# Patient Record
Sex: Female | Born: 1970
Health system: Southern US, Community
[De-identification: ages and names within clinical notes are randomized; demographics above are authoritative.]

## PROBLEM LIST (undated history)

## (undated) DIAGNOSIS — M25832 Other specified joint disorders, left wrist: Secondary | ICD-10-CM

## (undated) HISTORY — PX: BACK SURGERY: SHX140

## (undated) HISTORY — PX: APPENDECTOMY: SHX54

---

## 1998-06-08 ENCOUNTER — Ambulatory Visit (HOSPITAL_COMMUNITY): Admission: RE | Admit: 1998-06-08 | Discharge: 1998-06-08 | Payer: Self-pay | Admitting: Obstetrics and Gynecology

## 1998-08-07 ENCOUNTER — Inpatient Hospital Stay (HOSPITAL_COMMUNITY): Admission: AD | Admit: 1998-08-07 | Discharge: 1998-08-10 | Payer: Self-pay | Admitting: *Deleted

## 1998-08-11 ENCOUNTER — Encounter (HOSPITAL_COMMUNITY): Admission: RE | Admit: 1998-08-11 | Discharge: 1998-11-09 | Payer: Self-pay | Admitting: *Deleted

## 1999-09-26 ENCOUNTER — Ambulatory Visit (HOSPITAL_BASED_OUTPATIENT_CLINIC_OR_DEPARTMENT_OTHER): Admission: RE | Admit: 1999-09-26 | Discharge: 1999-09-26 | Payer: Self-pay | Admitting: Plastic Surgery

## 2000-10-17 ENCOUNTER — Inpatient Hospital Stay (HOSPITAL_COMMUNITY): Admission: AD | Admit: 2000-10-17 | Discharge: 2000-10-19 | Payer: Self-pay | Admitting: *Deleted

## 2000-12-02 ENCOUNTER — Other Ambulatory Visit: Admission: RE | Admit: 2000-12-02 | Discharge: 2000-12-02 | Payer: Self-pay | Admitting: *Deleted

## 2001-07-02 ENCOUNTER — Encounter: Payer: Self-pay | Admitting: Family Medicine

## 2001-07-02 ENCOUNTER — Ambulatory Visit (HOSPITAL_COMMUNITY): Admission: RE | Admit: 2001-07-02 | Discharge: 2001-07-02 | Payer: Self-pay | Admitting: Family Medicine

## 2001-07-16 ENCOUNTER — Encounter: Payer: Self-pay | Admitting: Family Medicine

## 2001-07-16 ENCOUNTER — Ambulatory Visit (HOSPITAL_COMMUNITY): Admission: RE | Admit: 2001-07-16 | Discharge: 2001-07-16 | Payer: Self-pay | Admitting: Family Medicine

## 2001-12-14 ENCOUNTER — Other Ambulatory Visit: Admission: RE | Admit: 2001-12-14 | Discharge: 2001-12-14 | Payer: Self-pay | Admitting: *Deleted

## 2003-01-17 ENCOUNTER — Other Ambulatory Visit: Admission: RE | Admit: 2003-01-17 | Discharge: 2003-01-17 | Payer: Self-pay | Admitting: *Deleted

## 2004-01-20 ENCOUNTER — Other Ambulatory Visit: Admission: RE | Admit: 2004-01-20 | Discharge: 2004-01-20 | Payer: Self-pay | Admitting: Obstetrics and Gynecology

## 2005-04-18 ENCOUNTER — Other Ambulatory Visit: Admission: RE | Admit: 2005-04-18 | Discharge: 2005-04-18 | Payer: Self-pay | Admitting: Obstetrics and Gynecology

## 2013-10-20 ENCOUNTER — Emergency Department (HOSPITAL_COMMUNITY): Payer: BC Managed Care – PPO

## 2013-10-20 ENCOUNTER — Emergency Department (HOSPITAL_COMMUNITY)
Admission: EM | Admit: 2013-10-20 | Discharge: 2013-10-20 | Disposition: A | Discharge status: Home / Self Care | Payer: BC Managed Care – PPO | Attending: Emergency Medicine | Admitting: Emergency Medicine

## 2013-10-20 ENCOUNTER — Encounter (HOSPITAL_COMMUNITY): Payer: Self-pay | Admitting: Emergency Medicine

## 2013-10-20 DIAGNOSIS — R0789 Other chest pain: Secondary | ICD-10-CM | POA: Insufficient documentation

## 2013-10-20 DIAGNOSIS — Z791 Long term (current) use of non-steroidal anti-inflammatories (NSAID): Secondary | ICD-10-CM | POA: Insufficient documentation

## 2013-10-20 DIAGNOSIS — Z79899 Other long term (current) drug therapy: Secondary | ICD-10-CM | POA: Insufficient documentation

## 2013-10-20 LAB — CBC
HCT: 41.6 % (ref 36.0–46.0)
Hemoglobin: 13.8 g/dL (ref 12.0–15.0)
MCH: 32.1 pg (ref 26.0–34.0)
MCHC: 33.2 g/dL (ref 30.0–36.0)
MCV: 96.7 fL (ref 78.0–100.0)
PLATELETS: 379 10*3/uL (ref 150–400)
RBC: 4.3 MIL/uL (ref 3.87–5.11)
RDW: 12.4 % (ref 11.5–15.5)
WBC: 9.8 10*3/uL (ref 4.0–10.5)

## 2013-10-20 LAB — D-DIMER, QUANTITATIVE (NOT AT ARMC): D-Dimer, Quant: <0.27 ug/mL-FEU (ref 0.00–0.48)

## 2013-10-20 LAB — I-STAT TROPONIN, ED: Troponin i, poc: 0 ng/mL (ref 0.00–0.08)

## 2013-10-20 LAB — BASIC METABOLIC PANEL
ANION GAP: 14 (ref 5–15)
BUN: 15 mg/dL (ref 6–23)
CALCIUM: 9.4 mg/dL (ref 8.4–10.5)
CO2: 22 mEq/L (ref 19–32)
Chloride: 102 mEq/L (ref 96–112)
Creatinine, Ser: 0.84 mg/dL (ref 0.50–1.10)
GFR calc non Af Amer: 83 mL/min — ABNORMAL LOW (ref >90)
Glucose, Bld: 111 mg/dL — ABNORMAL HIGH (ref 70–99)
Potassium: 4.1 mEq/L (ref 3.7–5.3)
Sodium: 138 mEq/L (ref 137–147)

## 2013-10-20 LAB — PRO B NATRIURETIC PEPTIDE: Pro B Natriuretic peptide (BNP): 55 pg/mL (ref 0–125)

## 2013-10-20 MED ORDER — IBUPROFEN 800 MG PO TABS: 800.0000 mg | ORAL_TABLET | Freq: Three times a day (TID) | ORAL | Status: DC | PRN

## 2013-10-20 MED ORDER — HYDROCODONE-ACETAMINOPHEN 5-325 MG PO TABS: 1.0000 | ORAL_TABLET | Freq: Four times a day (QID) | ORAL | Status: DC | PRN

## 2013-10-20 NOTE — ED Notes (Signed)
ekg shown to dr Winfred Leeds

## 2013-10-20 NOTE — ED Notes (Signed)
Introduced self to pt.  Transport here to take pt for CXR.

## 2013-10-20 NOTE — ED Notes (Signed)
Pt. reports mid/left chest pain onset 2 weeks ago with mild SOB , denies nausea/ no diaphoresis , pain worse with deep inspiration . No cough or congestion .

## 2013-10-20 NOTE — ED Notes (Signed)
Pain off and on over past two weeks, left mid chest.  Pain worsens with deep inspiration.  Pain has been constant ache since yesterday, feels "tight" with deep inspiration. Skin pink, wm, dry.  Does not appear short of breath.

## 2013-10-20 NOTE — ED Provider Notes (Addendum)
CSN: [id number]     Arrival date & time 10/20/13  1850 History   None    Chief Complaint  Patient presents with  . Chest Pain     (Consider location/radiation/quality/duration/timing/severity/associated sxs/prior Treatment) HPI Complaint of left-sided parasternal pleuritic chest pain onset 2 weeks ago, intermittent. Initially however [place] 1 PM yesterday. Pain worse with deep inspiration . Associated symptoms include no shortness of breath. No nausea no vomiting no other associated symptoms. No treatment prior to coming here. Seen at a walk-in clinic today sent here for further evaluation. History reviewed. No pertinent past medical history. Past Surgical History  Procedure Laterality Date  . Appendectomy     No family history on file. History  Substance Use Topics  . Smoking status: Never Smoker   . Smokeless tobacco: Not on file  . Alcohol Use: Yes   cardiac risk factors none OB History   Grav Para Term Preterm Abortions TAB SAB Ect Mult Living                 Review of Systems  Constitutional: Negative.   HENT: Negative.   Respiratory: Negative.   Cardiovascular: Positive for chest pain.  Gastrointestinal: Negative.   Musculoskeletal: Negative.   Skin: Negative.   Neurological: Negative.   Psychiatric/Behavioral: Negative.   All other systems reviewed and are negative.     Allergies  Sulfa antibiotics  Home Medications   Prior to Admission medications   Medication Sig Start Date End Date Taking? Authorizing Provider  cetirizine (ZYRTEC) 10 MG tablet Take 10 mg by mouth daily as needed for allergies.   Yes Historical Provider, MD  naproxen sodium (ANAPROX) 220 MG tablet Take 220 mg by mouth daily as needed (for pain).   Yes Historical Provider, MD  Norethin Ace-Eth Estrad-FE (MINASTRIN 24 FE PO) Take 1 tablet by mouth daily.   Yes Historical Provider, MD   BP 138/99  Pulse 71  Temp(Src) 98.3 F (36.8 C) (Oral)  Resp 18  SpO2 100% Physical Exam   Nursing note and vitals reviewed. Constitutional: She appears well-developed and well-nourished.  HENT:  Head: Normocephalic and atraumatic.  Eyes: Conjunctivae are normal. Pupils are equal, round, and reactive to light.  Neck: Neck supple. No tracheal deviation present. No thyromegaly present.  Cardiovascular: Normal rate and regular rhythm.   No murmur heard. Pulmonary/Chest: Effort normal and breath sounds normal. She exhibits tenderness.  Tender left parasternal area  Abdominal: Soft. Bowel sounds are normal. She exhibits no distension. There is no tenderness.  Musculoskeletal: Normal range of motion. She exhibits no edema and no tenderness.  Neurological: She is alert. Coordination normal.  Skin: Skin is warm and dry. No rash noted.  Psychiatric: She has a normal mood and affect.    ED Course  Procedures (including critical care time) Labs Review Labs Reviewed  BASIC METABOLIC PANEL - Abnormal; Notable for the following:    Glucose, Bld 111 (*)    GFR calc non Af Amer 83 (*)    All other components within normal limits  CBC  PRO B NATRIURETIC PEPTIDE  I-STAT TROPOININ, ED    Imaging Review No results found.   EKG Interpretation   Date/Time:  Wednesday October 20 2013 18:56:55 EDT Ventricular Rate:  71 PR Interval:  148 QRS Duration: 68 QT Interval:  364 QTC Calculation: 395 R Axis:   64 Text Interpretation:  Normal sinus rhythm Normal ECG No old tracing to  compare Confirmed by Winfred Leeds  MD, Loren Sawaya [phone]) on 10/20/2013  8:38:34 PM     Chest x-ray viewed by me Declines pain medicine in ED Results for orders placed during the hospital encounter of 10/20/13  CBC      Result Value Ref Range   WBC 9.8  4.0 - 10.5 K/uL   RBC 4.30  3.87 - 5.11 MIL/uL   Hemoglobin 13.8  12.0 - 15.0 g/dL   HCT 41.6  36.0 - 46.0 %   MCV 96.7  78.0 - 100.0 fL   MCH 32.1  26.0 - 34.0 pg   MCHC 33.2  30.0 - 36.0 g/dL   RDW 12.4  11.5 - 15.5 %   Platelets 379  150 - 400 K/uL  BASIC  METABOLIC PANEL      Result Value Ref Range   Sodium 138  137 - 147 mEq/L   Potassium 4.1  3.7 - 5.3 mEq/L   Chloride 102  96 - 112 mEq/L   CO2 22  19 - 32 mEq/L   Glucose, Bld 111 (*) 70 - 99 mg/dL   BUN 15  6 - 23 mg/dL   Creatinine, Ser 0.84  0.50 - 1.10 mg/dL   Calcium 9.4  8.4 - 10.5 mg/dL   GFR calc non Af Amer 83 (*) >90 mL/min   GFR calc Af Amer >90  >90 mL/min   Anion gap 14  5 - 15  PRO B NATRIURETIC PEPTIDE      Result Value Ref Range   Pro B Natriuretic peptide (BNP) 55.0  0 - 125 pg/mL  D-DIMER, QUANTITATIVE      Result Value Ref Range   D-Dimer, Quant <0.27  0.00 - 0.48 ug/mL-FEU  I-STAT TROPOININ, ED      Result Value Ref Range   Troponin i, poc 0.00  0.00 - 0.08 ng/mL   Comment 3            Dg Chest 2 View  10/20/2013   CLINICAL DATA:  Mid to left chest pain for 2 weeks with shortness of breath. Pain worsens with deep inspiration.  EXAM: CHEST  2 VIEW  COMPARISON:  None.  FINDINGS: The heart size and mediastinal contours are normal. The lungs are clear. There is no pleural effusion or pneumothorax. No acute osseous findings are identified.  IMPRESSION: No active cardiopulmonary process demonstrated.   Electronically Signed   By: Camie Patience M.D.   On: 10/20/2013 20:36    MDM  Heart score equals 0.  well score equals 0-3 Final diagnoses:  None   strongly doubt acute coronary syndrome with normal EKG, negative troponin. For pulmonary embolism. Clinical suspicion negative d-dimer. Plan prescriptions Motrin, Norco. Referral back to her primary care physician Dr. Sabra Heck Diagnosis atypical chest pain     Orlie Dakin, MD 10/20/13 0100  Orlie Dakin, MD 10/20/13 2145

## 2013-10-20 NOTE — Discharge Instructions (Signed)

## 2017-04-08 DIAGNOSIS — N924 Excessive bleeding in the premenopausal period: Secondary | ICD-10-CM | POA: Diagnosis not present

## 2017-04-08 DIAGNOSIS — N939 Abnormal uterine and vaginal bleeding, unspecified: Secondary | ICD-10-CM | POA: Diagnosis not present

## 2017-04-08 DIAGNOSIS — N920 Excessive and frequent menstruation with regular cycle: Secondary | ICD-10-CM | POA: Diagnosis not present

## 2017-06-20 DIAGNOSIS — L814 Other melanin hyperpigmentation: Secondary | ICD-10-CM | POA: Diagnosis not present

## 2017-06-20 DIAGNOSIS — D225 Melanocytic nevi of trunk: Secondary | ICD-10-CM | POA: Diagnosis not present

## 2017-06-20 DIAGNOSIS — D1801 Hemangioma of skin and subcutaneous tissue: Secondary | ICD-10-CM | POA: Diagnosis not present

## 2017-06-20 DIAGNOSIS — D485 Neoplasm of uncertain behavior of skin: Secondary | ICD-10-CM | POA: Diagnosis not present

## 2017-07-10 DIAGNOSIS — L988 Other specified disorders of the skin and subcutaneous tissue: Secondary | ICD-10-CM | POA: Diagnosis not present

## 2017-07-10 DIAGNOSIS — D485 Neoplasm of uncertain behavior of skin: Secondary | ICD-10-CM | POA: Diagnosis not present

## 2017-08-27 DIAGNOSIS — M67432 Ganglion, left wrist: Secondary | ICD-10-CM | POA: Diagnosis not present

## 2017-09-17 ENCOUNTER — Other Ambulatory Visit: Payer: Self-pay | Admitting: Orthopedic Surgery

## 2017-09-17 DIAGNOSIS — M67432 Ganglion, left wrist: Secondary | ICD-10-CM | POA: Insufficient documentation

## 2017-10-07 ENCOUNTER — Other Ambulatory Visit: Payer: Self-pay

## 2017-10-07 ENCOUNTER — Encounter (HOSPITAL_BASED_OUTPATIENT_CLINIC_OR_DEPARTMENT_OTHER): Payer: Self-pay | Admitting: *Deleted

## 2017-10-13 DIAGNOSIS — R300 Dysuria: Secondary | ICD-10-CM | POA: Diagnosis not present

## 2017-10-13 DIAGNOSIS — R82998 Other abnormal findings in urine: Secondary | ICD-10-CM | POA: Diagnosis not present

## 2017-10-13 DIAGNOSIS — N760 Acute vaginitis: Secondary | ICD-10-CM | POA: Diagnosis not present

## 2017-10-14 ENCOUNTER — Encounter (HOSPITAL_BASED_OUTPATIENT_CLINIC_OR_DEPARTMENT_OTHER): Payer: Self-pay | Admitting: Certified Registered"

## 2017-10-14 ENCOUNTER — Other Ambulatory Visit: Payer: Self-pay

## 2017-10-14 ENCOUNTER — Ambulatory Visit (HOSPITAL_BASED_OUTPATIENT_CLINIC_OR_DEPARTMENT_OTHER): Payer: 59 | Admitting: Anesthesiology

## 2017-10-14 ENCOUNTER — Encounter (HOSPITAL_BASED_OUTPATIENT_CLINIC_OR_DEPARTMENT_OTHER)
Admission: RE | Disposition: A | Discharge status: Home / Self Care | Payer: Self-pay | Source: Ambulatory Visit | Attending: Orthopedic Surgery

## 2017-10-14 ENCOUNTER — Ambulatory Visit (HOSPITAL_BASED_OUTPATIENT_CLINIC_OR_DEPARTMENT_OTHER)
Admission: RE | Admit: 2017-10-14 | Discharge: 2017-10-14 | Disposition: A | Discharge status: Home / Self Care | Payer: 59 | Source: Ambulatory Visit | Attending: Orthopedic Surgery | Admitting: Orthopedic Surgery

## 2017-10-14 DIAGNOSIS — M67432 Ganglion, left wrist: Secondary | ICD-10-CM | POA: Diagnosis not present

## 2017-10-14 DIAGNOSIS — Z882 Allergy status to sulfonamides status: Secondary | ICD-10-CM | POA: Diagnosis not present

## 2017-10-14 HISTORY — DX: Other specified joint disorders, left wrist: M25.832

## 2017-10-14 HISTORY — PX: MASS EXCISION: SHX2000

## 2017-10-14 LAB — POCT PREGNANCY, URINE: Preg Test, Ur: NEGATIVE

## 2017-10-14 SURGERY — EXCISION MASS
Anesthesia: Monitor Anesthesia Care | Site: Arm Lower | Laterality: Left

## 2017-10-14 MED ORDER — PROPOFOL 500 MG/50ML IV EMUL
INTRAVENOUS | Status: DC | PRN
  Administered 2017-10-14: 100 ug/kg/min via INTRAVENOUS

## 2017-10-14 MED ORDER — FENTANYL CITRATE (PF) 100 MCG/2ML IJ SOLN
INTRAMUSCULAR | Status: AC
  Filled 2017-10-14: qty 2

## 2017-10-14 MED ORDER — ONDANSETRON HCL 4 MG/2ML IJ SOLN
INTRAMUSCULAR | Status: AC
  Filled 2017-10-14: qty 10

## 2017-10-14 MED ORDER — EPHEDRINE SULFATE 50 MG/ML IJ SOLN
INTRAMUSCULAR | Status: AC
Start: 2017-10-14 — End: ?
  Filled 2017-10-14: qty 2

## 2017-10-14 MED ORDER — LIDOCAINE HCL (PF) 0.5 % IJ SOLN
INTRAMUSCULAR | Status: DC | PRN
  Administered 2017-10-14: 50 mL via INTRAVENOUS

## 2017-10-14 MED ORDER — FENTANYL CITRATE (PF) 100 MCG/2ML IJ SOLN
50.0000 ug | INTRAMUSCULAR | Status: DC | PRN
  Administered 2017-10-14: 100 ug via INTRAVENOUS

## 2017-10-14 MED ORDER — DEXAMETHASONE SODIUM PHOSPHATE 10 MG/ML IJ SOLN
INTRAMUSCULAR | Status: AC
  Filled 2017-10-14: qty 2

## 2017-10-14 MED ORDER — CEFAZOLIN SODIUM-DEXTROSE 2-4 GM/100ML-% IV SOLN
2.0000 g | INTRAVENOUS | Status: AC
  Administered 2017-10-14: 2 g via INTRAVENOUS

## 2017-10-14 MED ORDER — BUPIVACAINE HCL (PF) 0.25 % IJ SOLN
INTRAMUSCULAR | Status: AC
  Filled 2017-10-14: qty 30

## 2017-10-14 MED ORDER — CEFAZOLIN SODIUM-DEXTROSE 2-4 GM/100ML-% IV SOLN
INTRAVENOUS | Status: AC
  Filled 2017-10-14: qty 100

## 2017-10-14 MED ORDER — LIDOCAINE HCL (CARDIAC) PF 100 MG/5ML IV SOSY
PREFILLED_SYRINGE | INTRAVENOUS | Status: AC
  Filled 2017-10-14: qty 20

## 2017-10-14 MED ORDER — MIDAZOLAM HCL 2 MG/2ML IJ SOLN
1.0000 mg | INTRAMUSCULAR | Status: DC | PRN
  Administered 2017-10-14: 2 mg via INTRAVENOUS

## 2017-10-14 MED ORDER — DEXAMETHASONE SODIUM PHOSPHATE 10 MG/ML IJ SOLN
INTRAMUSCULAR | Status: AC
Start: 2017-10-14 — End: ?
  Filled 2017-10-14: qty 1

## 2017-10-14 MED ORDER — BUPIVACAINE HCL (PF) 0.25 % IJ SOLN
INTRAMUSCULAR | Status: DC | PRN
  Administered 2017-10-14: 6 mL

## 2017-10-14 MED ORDER — LIDOCAINE HCL (CARDIAC) PF 100 MG/5ML IV SOSY
PREFILLED_SYRINGE | INTRAVENOUS | Status: DC | PRN
  Administered 2017-10-14: 20 mg via INTRAVENOUS

## 2017-10-14 MED ORDER — PROMETHAZINE HCL 25 MG/ML IJ SOLN: 6.2500 mg | INTRAMUSCULAR | Status: DC | PRN

## 2017-10-14 MED ORDER — MIDAZOLAM HCL 2 MG/2ML IJ SOLN
INTRAMUSCULAR | Status: AC
Start: 2017-10-14 — End: ?
  Filled 2017-10-14: qty 2

## 2017-10-14 MED ORDER — ONDANSETRON HCL 4 MG/2ML IJ SOLN
INTRAMUSCULAR | Status: DC | PRN
  Administered 2017-10-14: 4 mg via INTRAVENOUS

## 2017-10-14 MED ORDER — LACTATED RINGERS IV SOLN
INTRAVENOUS | Status: DC
  Administered 2017-10-14: 10:00:00 via INTRAVENOUS

## 2017-10-14 MED ORDER — HYDROCODONE-ACETAMINOPHEN 5-325 MG PO TABS: 1.0000 | ORAL_TABLET | Freq: Four times a day (QID) | ORAL | 0 refills | Status: DC | PRN

## 2017-10-14 MED ORDER — SCOPOLAMINE 1 MG/3DAYS TD PT72: 1.0000 | MEDICATED_PATCH | Freq: Once | TRANSDERMAL | Status: DC | PRN

## 2017-10-14 MED ORDER — FENTANYL CITRATE (PF) 100 MCG/2ML IJ SOLN: 25.0000 ug | INTRAMUSCULAR | Status: DC | PRN

## 2017-10-14 SURGICAL SUPPLY — 46 items
BANDAGE COBAN STERILE 2 (GAUZE/BANDAGES/DRESSINGS) IMPLANT
BLADE SURG 15 STRL LF DISP TIS (BLADE) ×1 IMPLANT
BLADE SURG 15 STRL SS (BLADE) ×3
BNDG CMPR 9X4 STRL LF SNTH (GAUZE/BANDAGES/DRESSINGS)
BNDG COHESIVE 1X5 TAN STRL LF (GAUZE/BANDAGES/DRESSINGS) IMPLANT
BNDG COHESIVE 3X5 TAN STRL LF (GAUZE/BANDAGES/DRESSINGS) ×2 IMPLANT
BNDG ESMARK 4X9 LF (GAUZE/BANDAGES/DRESSINGS) IMPLANT
BNDG GAUZE ELAST 4 BULKY (GAUZE/BANDAGES/DRESSINGS) IMPLANT
CHLORAPREP W/TINT 26ML (MISCELLANEOUS) ×3 IMPLANT
CORD BIPOLAR FORCEPS 12FT (ELECTRODE) ×3 IMPLANT
COVER BACK TABLE 60X90IN (DRAPES) ×3 IMPLANT
COVER MAYO STAND STRL (DRAPES) ×3 IMPLANT
CUFF TOURNIQUET SINGLE 18IN (TOURNIQUET CUFF) IMPLANT
DECANTER SPIKE VIAL GLASS SM (MISCELLANEOUS) IMPLANT
DRAIN PENROSE 1/2X12 LTX STRL (WOUND CARE) IMPLANT
DRAPE EXTREMITY T 121X128X90 (DRAPE) ×3 IMPLANT
DRAPE SURG 17X23 STRL (DRAPES) ×3 IMPLANT
GAUZE SPONGE 4X4 12PLY STRL (GAUZE/BANDAGES/DRESSINGS) ×3 IMPLANT
GAUZE XEROFORM 1X8 LF (GAUZE/BANDAGES/DRESSINGS) ×3 IMPLANT
GLOVE BIO SURGEON STRL SZ 6.5 (GLOVE) ×1 IMPLANT
GLOVE BIO SURGEONS STRL SZ 6.5 (GLOVE) ×1
GLOVE BIOGEL PI IND STRL 7.0 (GLOVE) IMPLANT
GLOVE BIOGEL PI IND STRL 8.5 (GLOVE) ×1 IMPLANT
GLOVE BIOGEL PI INDICATOR 7.0 (GLOVE) ×4
GLOVE BIOGEL PI INDICATOR 8.5 (GLOVE) ×2
GLOVE SURG ORTHO 8.0 STRL STRW (GLOVE) ×3 IMPLANT
GOWN STRL REUS W/ TWL LRG LVL3 (GOWN DISPOSABLE) ×1 IMPLANT
GOWN STRL REUS W/TWL LRG LVL3 (GOWN DISPOSABLE) ×3
GOWN STRL REUS W/TWL XL LVL3 (GOWN DISPOSABLE) ×3 IMPLANT
NDL PRECISIONGLIDE 27X1.5 (NEEDLE) ×1 IMPLANT
NDL SAFETY ECLIPSE 18X1.5 (NEEDLE) IMPLANT
NEEDLE HYPO 18GX1.5 SHARP (NEEDLE)
NEEDLE PRECISIONGLIDE 27X1.5 (NEEDLE) ×3 IMPLANT
NS IRRIG 1000ML POUR BTL (IV SOLUTION) ×3 IMPLANT
PACK BASIN DAY SURGERY FS (CUSTOM PROCEDURE TRAY) ×3 IMPLANT
PAD CAST 3X4 CTTN HI CHSV (CAST SUPPLIES) IMPLANT
PADDING CAST COTTON 3X4 STRL (CAST SUPPLIES) ×3
SPLINT PLASTER CAST XFAST 3X15 (CAST SUPPLIES) IMPLANT
SPLINT PLASTER XTRA FASTSET 3X (CAST SUPPLIES) ×10
STOCKINETTE 4X48 STRL (DRAPES) ×3 IMPLANT
SUT ETHILON 4 0 PS 2 18 (SUTURE) ×3 IMPLANT
SUT VIC AB 4-0 P2 18 (SUTURE) IMPLANT
SYR BULB 3OZ (MISCELLANEOUS) ×3 IMPLANT
SYR CONTROL 10ML LL (SYRINGE) ×3 IMPLANT
TOWEL GREEN STERILE FF (TOWEL DISPOSABLE) ×3 IMPLANT
UNDERPAD 30X30 (UNDERPADS AND DIAPERS) ×3 IMPLANT

## 2017-10-14 NOTE — Transfer of Care (Signed)
Immediate Anesthesia Transfer of Care Note  Patient: Margaret Small  Procedure(s) Performed: EXCISION CYST LEFT WRIST VOLAR (Left Arm Lower)  Patient Location: PACU  Anesthesia Type:Bier block  Level of Consciousness: awake, alert , oriented and patient cooperative  Airway & Oxygen Therapy: Patient Spontanous Breathing  Post-op Assessment: Report given to RN and Post -op Vital signs reviewed and stable  Post vital signs: Reviewed and stable  Last Vitals:  Vitals Value Taken Time  BP    Temp    Pulse    Resp    SpO2      Last Pain:  Vitals:   10/14/17 0914  TempSrc: Oral  PainSc: 0-No pain         Complications: No apparent anesthesia complications

## 2017-10-14 NOTE — Anesthesia Preprocedure Evaluation (Addendum)
Anesthesia Evaluation  Patient identified by MRN, date of birth, ID band Patient awake    Reviewed: Allergy & Precautions, NPO status , Patient's Chart, lab work & pertinent test results  Airway Mallampati: II  TM Distance: >3 FB Neck ROM: Full    Dental  (+) Teeth Intact, Dental Advisory Given   Pulmonary neg pulmonary ROS,    Pulmonary exam normal breath sounds clear to auscultation       Cardiovascular negative cardio ROS Normal cardiovascular exam Rhythm:Regular Rate:Normal     Neuro/Psych negative neurological ROS  negative psych ROS   GI/Hepatic negative GI ROS, Neg liver ROS,   Endo/Other  negative endocrine ROS  Renal/GU negative Renal ROS     Musculoskeletal MASS LEFT WRIST   Abdominal   Peds  Hematology negative hematology ROS (+)   Anesthesia Other Findings Day of surgery medications reviewed with the patient.  Reproductive/Obstetrics negative OB ROS                            Anesthesia Physical Anesthesia Plan  ASA: II  Anesthesia Plan: MAC and Bier Block and Bier Block-LIDOCAINE ONLY   Post-op Pain Management:    Induction: Intravenous  PONV Risk Score and Plan: 2 and Propofol infusion and Ondansetron  Airway Management Planned: Nasal Cannula and Natural Airway  Additional Equipment:   Intra-op Plan:   Post-operative Plan:   Informed Consent: I have reviewed the patients History and Physical, chart, labs and discussed the procedure including the risks, benefits and alternatives for the proposed anesthesia with the patient or authorized representative who has indicated his/her understanding and acceptance.   Dental advisory given  Plan Discussed with: CRNA  Anesthesia Plan Comments:         Anesthesia Quick Evaluation

## 2017-10-14 NOTE — Anesthesia Procedure Notes (Signed)
Procedure Name: MAC Date/Time: 10/14/2017 10:37 AM Performed by: Signe Colt, CRNA Pre-anesthesia Checklist: Patient identified, Emergency Drugs available, Suction available, Patient being monitored and Timeout performed Patient Re-evaluated:Patient Re-evaluated prior to induction Oxygen Delivery Method: Simple face mask

## 2017-10-14 NOTE — Op Note (Signed)
NAME: Margaret Small RECORD NO: [id number] DATE OF BIRTH: [birth year] FACILITY: [facility] LOCATION: [facility] PHYSICIAN: Wynonia Sours, MD   OPERATIVE REPORT   DATE OF PROCEDURE: 10/14/17    PREOPERATIVE DIAGNOSIS: Volar wrist ganglion left wrist   POSTOPERATIVE DIAGNOSIS:   Same   PROCEDURE:   Excision volar wrist ganglion left wrist   SURGEON: Daryll Brod, M.D.   ASSISTANT: none   ANESTHESIA:  Bier block with sedation and Local   INTRAVENOUS FLUIDS:  Per anesthesia flow sheet.   ESTIMATED BLOOD LOSS:  Minimal.   COMPLICATIONS:  None.   SPECIMENS:  none   TOURNIQUET TIME:    Total Tourniquet Time Documented: Upper Arm (Left) - 33 minutes Total: Upper Arm (Left) - 33 minutes    DISPOSITION:  Stable to PACU.   INDICATIONS: Patient is a 47 year old female with a large cyst on the volar radial aspect of left wrist.  She is elected to undergo surgical excision of this.  She is aware of risks and complications including infection recurrence injury to arteries nerves tendons and complete relief symptoms of the digit and dystrophy.  In the preoperative area the patient is seen extremity marked by both patient and surgeon antibiotic given  OPERATIVE COURSE: Patient is brought to the operating room where a upper arm IV regional was carried out without difficulty under the direction of the anesthesia department.  She was prepped using ChloraPrep in the supine position with the left arm free.  A three-minute dry time was allowed timeout taken to confirm patient procedure.  IV sedation was given.  A curvilinear incision was made over the mass on the volar radial aspect left wrist.  This carried down through subcutaneous tissue.  Bleeders were electrocauterized with bipolar.  The radial artery was identified along with the cyst directly underneath.  With blunt sharp dissection the artery was dissected free.  The cyst was then dissected free from surrounding  tissue.  This was an extremely large cyst going down to the radiocarpal joint.  This was followed stalk into the radiocarpal joint.  This area was debrided with a rondure.  Wound was copious irrigated with saline.  The specimen was sent to pathology.  The opening in the radiocarpal joint was then closed with a figure-of-eight 4-0 Vicryl sutures.  This was done with at least 2 separate sutures.  The subcutaneous tissue was closed interrupted 4-0 Vicryl and the skin with interrupted 4-0 nylon sutures.  Local infiltration with quarter percent bupivacaine without epinephrine was given approximately 6 cc was used.  A sterile compressive dressing volar wrist splint was applied.  Deflation of the tourniquet all fingers immediately pink.  She was taken to the recovery room for observation in satisfactory condition.  She will be discharged home to return [facility] in 1 week on [place].  She will try Motrin and ibuprofen first.  She will have the [place] for breakthrough.  She will return to [place] agrees were in 1 week.   Wynonia Sours, MD Electronically signed, 10/14/17

## 2017-10-14 NOTE — H&P (Signed)
Margaret Small is an 47 y.o. female.   Chief Complaint: mass left wrist HPI: Margaret Small is a 47 year old right-hand-dominant female referred by Dr. Luciana Axe tech for consultation regarding a mass of the volar radial aspect of her left wrist. This been present for approximately a year. She saw Dr. Luciana Axe tech recently had x-rays done revealing no significant degenerative changes had an aspiration injection but the cyst has recurred. She states that very thick fluid clear in nature came out. She has no prior history of injury. She has an aching type pain with a VAS score 5/10. This includes her entire thumb. States lifting gripping pinching increases discomfort for her. She has no history of diabetes thyroid problems arthritis gout. Family history is positive diabetes negative for thyroid problems arthritis and gout      Past Medical History:  Diagnosis Date  . Mass of joint of left wrist     Past Surgical History:  Procedure Laterality Date  . APPENDECTOMY    . BACK SURGERY      History reviewed. No pertinent family history. Social History:  reports that she has never smoked. She has never used smokeless tobacco. She reports that she drinks alcohol. She reports that she does not use drugs.  Allergies:  Allergies  Allergen Reactions  . Sulfa Antibiotics Rash    No medications prior to admission.    No results found for this or any previous visit (from the past 48 hour(s)).  No results found.   Pertinent items are noted in HPI.  Height 5\' 6"  (1.676 m), weight 76.2 kg (168 lb), last menstrual period 09/30/2017.  General appearance: alert, cooperative and appears stated age Head: Normocephalic, without obvious abnormality Neck: no JVD Resp: clear to auscultation bilaterally Cardio: regular rate and rhythm, S1, S2 normal, no murmur, click, rub or gallop GI: soft, non-tender; bowel sounds normal; no masses,  no organomegaly Extremities: mass left wrist Pulses: 2+ and  symmetric Skin: Skin color, texture, turgor normal. No rashes or lesions Neurologic: Grossly normal Incision/Wound: na  Assessment/Plan Assessment:  1. Ganglion of left wrist    Plan: We have discussed with her the etiology of volar wrist ganglions. Dr. Mortimer Fries notes are reviewed. We she would like to have this removed. Pre-peri-and postoperative course are discussed along with risks and complications. She is aware there is no guarantee to the surgery the possibility of infection recurrence injury to arteries nerves tendons complete relief symptoms dystrophy a 20% recurrence rate. She is advised that some of her pain may be coming from her thumb rather than a cyst. Questions are encouraged and answered to her satisfaction. She is scheduled for excision of volar radial wrist ganglion left wrist and outpatient under regional anesthesia.      Margaret Small R 10/14/2017, 5:38 AM

## 2017-10-14 NOTE — Anesthesia Procedure Notes (Signed)
Anesthesia Regional Block: Bier block (IV Regional)   Pre-Anesthetic Checklist: ,, timeout performed, Correct Patient, Correct Site, Correct Laterality, Correct Procedure,, site marked, surgical consent,, at surgeon's request  Laterality: Left     Needles:  Injection technique: Single-shot  Needle Type: Other      Needle Gauge: 22     Additional Needles:   Procedures:,,,,, intact distal pulses, Esmarch exsanguination,, double tourniquet utilized  Narrative:   Performed by: Personally       

## 2017-10-14 NOTE — Brief Op Note (Signed)
10/14/2017  11:00 AM  PATIENT:  Margaret Small  47 y.o. female  PRE-OPERATIVE DIAGNOSIS:  MASS LEFT WRIST  POST-OPERATIVE DIAGNOSIS:  MASS LEFT WRIST  PROCEDURE:  Procedure(s) with comments: EXCISION CYST LEFT WRIST VOLAR (Left) - REG/UPPERARM  SURGEON:  Surgeon(s) and Role:    Daryll Brod, MD - Primary  PHYSICIAN ASSISTANT:   ASSISTANTS: none   ANESTHESIA:   local, regional and IV sedation  EBL:  46ml  BLOOD ADMINISTERED:none  DRAINS: none   LOCAL MEDICATIONS USED:  BUPIVICAINE   SPECIMEN:  Excision  DISPOSITION OF SPECIMEN:  PATHOLOGY  COUNTS:  YES  TOURNIQUET:   Total Tourniquet Time Documented: Upper Arm (Left) - 33 minutes Total: Upper Arm (Left) - 33 minutes   DICTATION: .Viviann Spare Dictation  PLAN OF CARE: Discharge to home after PACU  PATIENT DISPOSITION:  PACU - hemodynamically stable.

## 2017-10-14 NOTE — Discharge Instructions (Addendum)

## 2017-10-15 ENCOUNTER — Encounter (HOSPITAL_BASED_OUTPATIENT_CLINIC_OR_DEPARTMENT_OTHER): Payer: Self-pay | Admitting: Orthopedic Surgery

## 2017-10-15 NOTE — Anesthesia Postprocedure Evaluation (Signed)
Anesthesia Post Note  Patient: Margaret Small  Procedure(s) Performed: EXCISION CYST LEFT WRIST VOLAR (Left Arm Lower)     Patient location during evaluation: PACU Anesthesia Type: MAC Level of consciousness: awake and alert Pain management: pain level controlled Vital Signs Assessment: post-procedure vital signs reviewed and stable Respiratory status: spontaneous breathing, nonlabored ventilation and respiratory function stable Cardiovascular status: stable and blood pressure returned to baseline Postop Assessment: no apparent nausea or vomiting Anesthetic complications: no    Last Vitals:  Vitals:   10/14/17 1128 10/14/17 1143  BP:  125/78  Pulse: 65 69  Resp: 11 18  Temp:  36.6 C  SpO2: 100% 100%    Last Pain:  Vitals:   10/14/17 1143  TempSrc:   PainSc: 0-No pain                 Catalina Gravel

## 2018-01-19 ENCOUNTER — Ambulatory Visit: Payer: Self-pay | Admitting: Family Medicine

## 2018-01-19 ENCOUNTER — Telehealth: Payer: Self-pay | Admitting: Family Medicine

## 2018-01-19 NOTE — Telephone Encounter (Signed)
Called and spoke with pt regarding their appt with Dr. Brigitte Pulse on 02/23/18. Asked if they could switch their appt form 8:40 to 8:30 with arrival time of 8:20. Pt agreed. I advised of time, building and late policy. Pt acknowledged.  Pt asked if I knew what type of blood work they would be doing that day. I advised that they may not be doing any blood work that day. The pt is a NEW PATIENT and she is requesting a CPE the same day. I advised that our standard of care involves a NEW PATIENT visit and then return for a separate appt for a CPE. Pt stated that the man who made the appt argued with her that they would be doing blood work that day.  Pt stated that she would be ready to do blood work in case the  Provider will perform a CPE the same day.

## 2018-01-22 DIAGNOSIS — R197 Diarrhea, unspecified: Secondary | ICD-10-CM | POA: Diagnosis not present

## 2018-01-26 DIAGNOSIS — R197 Diarrhea, unspecified: Secondary | ICD-10-CM | POA: Diagnosis not present

## 2018-01-27 ENCOUNTER — Ambulatory Visit: Payer: Self-pay | Admitting: Family Medicine

## 2018-02-23 ENCOUNTER — Encounter: Payer: Self-pay | Admitting: Family Medicine

## 2018-02-23 ENCOUNTER — Ambulatory Visit: Payer: 59 | Admitting: Family Medicine

## 2018-02-23 ENCOUNTER — Other Ambulatory Visit: Payer: Self-pay

## 2018-02-23 VITALS — BP 129/84 | HR 76 | Temp 98.0°F | Resp 16 | Ht 65.75 in | Wt 175.2 lb

## 2018-02-23 DIAGNOSIS — Z136 Encounter for screening for cardiovascular disorders: Secondary | ICD-10-CM

## 2018-02-23 DIAGNOSIS — R140 Abdominal distension (gaseous): Secondary | ICD-10-CM

## 2018-02-23 DIAGNOSIS — Z0001 Encounter for general adult medical examination with abnormal findings: Secondary | ICD-10-CM | POA: Diagnosis not present

## 2018-02-23 DIAGNOSIS — Z87440 Personal history of urinary (tract) infections: Secondary | ICD-10-CM

## 2018-02-23 DIAGNOSIS — Z1383 Encounter for screening for respiratory disorder NEC: Secondary | ICD-10-CM

## 2018-02-23 DIAGNOSIS — R011 Cardiac murmur, unspecified: Secondary | ICD-10-CM

## 2018-02-23 DIAGNOSIS — R109 Unspecified abdominal pain: Secondary | ICD-10-CM

## 2018-02-23 DIAGNOSIS — Z23 Encounter for immunization: Secondary | ICD-10-CM | POA: Diagnosis not present

## 2018-02-23 DIAGNOSIS — Z1389 Encounter for screening for other disorder: Secondary | ICD-10-CM

## 2018-02-23 DIAGNOSIS — R198 Other specified symptoms and signs involving the digestive system and abdomen: Secondary | ICD-10-CM

## 2018-02-23 DIAGNOSIS — Z0000 Encounter for general adult medical examination without abnormal findings: Secondary | ICD-10-CM

## 2018-02-23 LAB — POCT URINALYSIS DIP (MANUAL ENTRY)
Bilirubin, UA: NEGATIVE
GLUCOSE UA: NEGATIVE mg/dL
Ketones, POC UA: NEGATIVE mg/dL
Leukocytes, UA: NEGATIVE
Nitrite, UA: NEGATIVE
PROTEIN UA: NEGATIVE mg/dL
Spec Grav, UA: 1.025 (ref 1.010–1.025)
UROBILINOGEN UA: 0.2 U/dL
pH, UA: 5.5 (ref 5.0–8.0)

## 2018-02-23 MED ORDER — TRIAMCINOLONE ACETONIDE 55 MCG/ACT NA AERO: 2.0000 | INHALATION_SPRAY | Freq: Every day | NASAL | 12 refills | Status: DC

## 2018-02-23 NOTE — Patient Instructions (Addendum)
Please stop by the front desk on your way out and sign a release for [place] [facility] so we can get your recent lab work.  Please call in a month if you have not heard from [place] so we can ensure that it was received.  Try Triple-Coated Peppermint Oil 180mg  three times a day - find at a health food/natural foods store probably - natural smooth muscle relaxant - the Triple coated means that the peppermint has a coating so that it won't be released in your stomach which worsen your reflux symptoms but instead not be released until it is in your bowels where it is needed to work.     If you have lab work done today you will be contacted with your lab results within the next 2 weeks.  If you have not heard from [place] then please contact us. The fastest way to get your results is to register for My Chart.   IF you received an x-ray today, you will receive an invoice from [facility]. Please contact [facility] at [phone] with questions or concerns regarding your invoice.   IF you received labwork today, you will receive an invoice from [facility]. Please contact [facility] at [phone] with questions or concerns regarding your invoice.   Our billing staff will not be able to assist you with questions regarding bills from these companies.  You will be contacted with the lab results as soon as they are available. The fastest way to get your results is to activate your My Chart account. Instructions are located on the last page of this paperwork. If you have not heard from [place] regarding the results in 2 weeks, please contact this office.    Probiotics What are probiotics? Probiotics are the good bacteria and yeasts that live in your body and keep you and your digestive system healthy. Probiotics also help your body's defense (immune) system and protect your body against bad bacterial growth. Certain foods contain probiotics, such as yogurt. Probiotics can also be purchased as a  supplement. As with any supplement or drug, it is important to discuss its use with your health care provider. What affects the balance of bacteria in my body? The balance of bacteria in your body can be affected by:  Antibiotic medicines. Antibiotics are sometimes necessary to treat infection. Unfortunately, they may kill good or friendly bacteria in your body as well as the bad bacteria. This may lead to stomach problems like diarrhea, gas, and cramping.  Disease. Some conditions are the result of an overgrowth of bad bacteria, yeasts, parasites, or fungi. These conditions include: ? Infectious diarrhea. ? Stomach and respiratory infections. ? Skin infections. ? Irritable bowel syndrome (IBS). ? Inflammatory bowel diseases. ? Ulcer due to Helicobacter pylori (H. pylori) infection. ? Tooth decay and periodontal disease. ? Vaginal infections.  Stress and poor diet may also lower the good bacteria in your body. What type of probiotic is right for me? Probiotics are available over the counter at your local pharmacy, health food, or grocery store. They come in many different forms, combinations of strains, and dosing strengths. Some may need to be refrigerated. Always read the label for storage and usage instructions. Specific strains have been shown to be more effective for certain conditions. Ask your health care provider what option is best for you. Why would I need probiotics? There are many reasons your health care provider might recommend a probiotic supplement, including:  Diarrhea.  Constipation.  IBS.  Respiratory infections.  Yeast infections.  Acne, eczema, and other skin conditions.  Frequent urinary tract infections (UTIs).  Are there side effects of probiotics? Some people experience mild side effects when taking probiotics. Side effects are usually temporary and may include:  Gas.  Bloating.  Cramping.  Rarely, serious side effects, such as infection or  immune system changes, may occur. What else do I need to know about probiotics?  There are many different strains of probiotics. Certain strains may be more effective depending on your condition. Probiotics are available in varying doses. Ask your health care provider which probiotic you should use and how often.  If you are taking probiotics along with antibiotics, it is generally recommended to wait at least 2 hours between taking the antibiotic and taking the probiotic. For more information: [facility] for Complementary and Alternative Medicine LocalChronicle.com.cy This information is not intended to replace advice given to you by your health care provider. Make sure you discuss any questions you have with your health care provider. Document Released: 10/13/2013 Document Revised: 02/13/2016 Document Reviewed: 06/15/2013 Elsevier Interactive Patient Education  2017 [place].  Diet for Irritable Bowel Syndrome When you have irritable bowel syndrome (IBS), the foods you eat and your eating habits are very important. IBS may cause various symptoms, such as abdominal pain, constipation, or diarrhea. Choosing the right foods can help ease discomfort caused by these symptoms. Work with your health care provider and dietitian to find the best eating plan to help control your symptoms. What general guidelines do I need to follow?  Keep a food diary. This will help you identify foods that cause symptoms. Write down: ? What you eat and when. ? What symptoms you have. ? When symptoms occur in relation to your meals.  Avoid foods that cause symptoms. Talk with your dietitian about other ways to get the same nutrients that are in these foods.  Eat more foods that contain fiber. Take a fiber supplement if directed by your dietitian.  Eat your meals slowly, in a relaxed setting.  Aim to eat 5-6 small meals per day. Do not skip meals.  Drink enough fluids to keep your urine clear or pale  yellow.  Ask your health care provider if you should take an over-the-counter probiotic during flare-ups to help restore healthy gut bacteria.  If you have cramping or diarrhea, try making your meals low in fat and high in carbohydrates. Examples of carbohydrates are pasta, rice, whole grain breads and cereals, fruits, and vegetables.  If dairy products cause your symptoms to flare up, try eating less of them. You might be able to handle yogurt better than other dairy products because it contains bacteria that help with digestion. What foods are not recommended? The following are some foods and drinks that may worsen your symptoms:  Fatty foods, such as [place] fries.  Milk products, such as cheese or ice cream.  Chocolate.  Alcohol.  Products with caffeine, such as coffee.  Carbonated drinks, such as soda.  The items listed above may not be a complete list of foods and beverages to avoid. Contact your dietitian for more information. What foods are good sources of fiber? Your health care provider or dietitian may recommend that you eat more foods that contain fiber. Fiber can help reduce constipation and other IBS symptoms. Add foods with fiber to your diet a little at a time so that your body can get used to them. Too much fiber at once might cause gas and swelling of  your abdomen. The following are some foods that are good sources of fiber:  Apples.  Peaches.  Pears.  Berries.  Figs.  Broccoli (raw).  Cabbage.  Carrots.  Raw peas.  Kidney beans.  Lima beans.  Whole grain bread.  Whole grain cereal.  Where to find more information: [employer] for Functional Gastrointestinal Disorders: www.iffgd.[employer] of Diabetes and Digestive and Kidney Diseases: NetworkAffair.co.za.aspx This information is not intended to replace advice given to you by your health care provider.  Make sure you discuss any questions you have with your health care provider. Document Released: 06/08/2003 Document Revised: 08/24/2015 Document Reviewed: 06/18/2013 Elsevier Interactive Patient Education  2018 [place].  Irritable Bowel Syndrome, Adult Irritable bowel syndrome (IBS) is not one specific disease. It is a group of symptoms that affects the organs responsible for digestion (gastrointestinal or GI tract). To regulate how your GI tract works, your body sends signals back and forth between your intestines and your brain. If you have IBS, there may be a problem with these signals. As a result, your GI tract does not function normally. Your intestines may become more sensitive and overreact to certain things. This is especially true when you eat certain foods or when you are under stress. There are four types of IBS. These may be determined based on the consistency of your stool:  IBS with diarrhea.  IBS with constipation.  Mixed IBS.  Unsubtyped IBS.  It is important to know which type of IBS you have. Some treatments are more likely to be helpful for certain types of IBS. What are the causes? The exact cause of IBS is not known. What increases the risk? You may have a higher risk of IBS if:  You are a woman.  You are younger than 49 years old.  You have a family history of IBS.  You have mental health problems.  You have had bacterial infection of your GI tract.  What are the signs or symptoms? Symptoms of IBS vary from person to person. The main symptom is abdominal pain or discomfort. Additional symptoms usually include one or more of the following:  Diarrhea, constipation, or both.  Abdominal swelling or bloating.  Feeling full or sick after eating a small or regular-size meal.  Frequent gas.  Mucus in the stool.  A feeling of having more stool left after a bowel movement.  Symptoms tend to come and go. They may be associated with stress, psychiatric  conditions, or nothing at all. How is this diagnosed? There is no specific test to diagnose IBS. Your health care provider will make a diagnosis based on a physical exam, medical history, and your symptoms. You may have other tests to rule out other conditions that may be causing your symptoms. These may include:  Blood tests.  X-rays.  CT scan.  Endoscopy and colonoscopy. This is a test in which your GI tract is viewed with a long, thin, flexible tube.  How is this treated? There is no cure for IBS, but treatment can help relieve symptoms. IBS treatment often includes:  Changes to your diet, such as: ? Eating more fiber. ? Avoiding foods that cause symptoms. ? Drinking more water. ? Eating regular, medium-sized portioned meals.  Medicines. These may include: ? Fiber supplements if you have constipation. ? Medicine to control diarrhea (antidiarrheal medicines). ? Medicine to help control muscle spasms in your GI tract (antispasmodic medicines). ? Medicines to help with any mental health issues, such as antidepressants or  tranquilizers.  Therapy. ? Talk therapy may help with anxiety, depression, or other mental health issues that can make IBS symptoms worse.  Stress reduction. ? Managing your stress can help keep symptoms under control.  Follow these instructions at home:  Take medicines only as directed by your health care provider.  Eat a healthy diet. ? Avoid foods and drinks with added sugar. ? Include more whole grains, fruits, and vegetables gradually into your diet. This may be especially helpful if you have IBS with constipation. ? Avoid any foods and drinks that make your symptoms worse. These may include dairy products and caffeinated or carbonated drinks. ? Do not eat large meals. ? Drink enough fluid to keep your urine clear or pale yellow.  Exercise regularly. Ask your health care provider for recommendations of good activities for you.  Keep all follow-up  visits as directed by your health care provider. This is important. Contact a health care provider if:  You have constant pain.  You have trouble or pain with swallowing.  You have worsening diarrhea. Get help right away if:  You have severe and worsening abdominal pain.  You have diarrhea and: ? You have a rash, stiff neck, or severe headache. ? You are irritable, sleepy, or difficult to awaken. ? You are weak, dizzy, or extremely thirsty.  You have bright red blood in your stool or you have black tarry stools.  You have unusual abdominal swelling that is painful.  You vomit continuously.  You vomit blood (hematemesis).  You have both abdominal pain and a fever. This information is not intended to replace advice given to you by your health care provider. Make sure you discuss any questions you have with your health care provider. Document Released: 03/18/2005 Document Revised: 08/18/2015 Document Reviewed: 12/03/2013 Elsevier Interactive Patient Education  2018 [employer].

## 2018-02-23 NOTE — Progress Notes (Signed)
Subjective:    Patient ID: Margaret Small; female   DOB: 04/08/1970; 47 y.o.   MRN: [id number]  Chief Complaint  Patient presents with  . Annual Exam    HPI Primary Preventative Screenings: Cervical Cancer: Sees gyn Dr Corinna Capra annually Family Planning: On OCPs. STI screening: Breast Cancer: mammogram annually with Dr. Corinna Capra. Colorectal Cancer: none  Prior but has t Tobacco use/EtOH/substances: Bone Density: Prev was eating yogurt daily, cheese, occ mvi.  Cardiac:   Mother and father w/ a. Fib. Occ butterflies.  Weight/Blood sugar/Diet/Exercise:  Always went to the gym reg but held off after wrist surg so going to restart - and has gained 15 lbs in past 6 mos.   BMI Readings from Last 3 Encounters:  02/23/18 28.50 kg/m  10/14/17 27.60 kg/m   No results found for: HGBA1C OTC/Vit/Supp/Herbal: Dentist/Optho: Has glasses - is near-sighted, no glaucoma. Reg dental.  Immunizations: TDaP was at 47 yo. Immunization History  Administered Date(s) Administered  . Influenza Inj Mdck Quad Pf 01/22/2018  . Influenza,inj,Quad PF,6+ Mos 02/08/2017     Chronic Medical Conditions: Diarrhea for a long time with increased bowel sounds ever since. Would have BM sev hrs after eating and alternate w/ 3-4d of constipation.  Had ganglion cyst removed on left wrist in July - did have general anesthesia with this. Then did have aright after UTI trx'd w/ abx w/ macrobid (yellow/black capsule) so never finished as caused whole body ached.   Went to [place] walk-in for the diarrhea as she had almost stopped eating and no appetite - had multiple stool samples - neg for c. Dif and others, referred to GI.  Even now having GI cramping. Appt at [facility] is next week.   Is taking isogenix isoflush which she resumed and has caused sig constipation for the past 2 weeks.  Has been trying to connect to nerves, not sure if specific food triggers. No prior h/o "nervous stomach."  Not using any otc meds for  constipation or diarrhea.  DId have thyroid and blood work done there.  Was eating a lot of yogurt but now held off.   PGM with colon cancer in 30s but dad is fine.   Still on coffee every day.  Feels full a lot. A little more bloating.   Does get some "period cramps" after eating.    Stays on zyrtec constantly. If goes off, gets a little rhinitis. Has cats which daughter is allergic too.    Medical History: Past Medical History:  Diagnosis Date  . Mass of joint of left wrist    Past Surgical History:  Procedure Laterality Date  . APPENDECTOMY    . BACK SURGERY    . MASS EXCISION Left 10/14/2017   Procedure: EXCISION CYST LEFT WRIST VOLAR;  Surgeon: Daryll Brod, MD;  Location: [place];  Service: Orthopedics;  Laterality: Left;  REG/UPPERARM   Current Outpatient Medications on File Prior to Visit  Medication Sig Dispense Refill  . cetirizine (ZYRTEC) 10 MG tablet Take 10 mg by mouth daily as needed for allergies.    Marland Kitchen ibuprofen (ADVIL,MOTRIN) 800 MG tablet Take 1 tablet (800 mg total) by mouth every 8 (eight) hours as needed for moderate pain. 21 tablet 0  . Norethin Ace-Eth Estrad-FE (MINASTRIN 24 FE PO) Take 1 tablet by mouth daily.     No current facility-administered medications on file prior to visit.    Allergies  Allergen Reactions  . Sulfa Antibiotics Rash  History reviewed. No pertinent family history. Social History   Socioeconomic History  . Marital status: Married    Spouse name: Not on file  . Number of children: Not on file  . Years of education: Not on file  . Highest education level: Not on file  Occupational History  . Not on file  Social Needs  . Financial resource strain: Not on file  . Food insecurity:    Worry: Not on file    Inability: Not on file  . Transportation needs:    Medical: Not on file    Non-medical: Not on file  Tobacco Use  . Smoking status: Never Smoker  . Smokeless tobacco: Never Used  Substance and Sexual  Activity  . Alcohol use: Yes    Comment: social  . Drug use: No  . Sexual activity: Not on file  Lifestyle  . Physical activity:    Days per week: Not on file    Minutes per session: Not on file  . Stress: Not on file  Relationships  . Social connections:    Talks on phone: Not on file    Gets together: Not on file    Attends religious service: Not on file    Active member of club or organization: Not on file    Attends meetings of clubs or organizations: Not on file    Relationship status: Not on file  Other Topics Concern  . Not on file  Social History Narrative  . Not on file   Depression screen [facility] 2/9 02/23/2018  Decreased Interest 0  Down, Depressed, Hopeless 0  PHQ - 2 Score 0     ROS Otherwise as noted in HPI.  Objective:  BP 129/84   Pulse 76   Temp 98 F (36.7 C) (Oral)   Resp 16   Ht 5' 5.75" (1.67 m)   Wt 175 lb 3.2 oz (79.5 kg)   SpO2 98%   BMI 28.50 kg/m   Visual Acuity Screening   Right eye Left eye Both eyes  Without correction: 20/25 20/25 20/25   With correction:      Physical Exam  Constitutional: She is oriented to person, place, and time. She appears well-developed and well-nourished. No distress.  HENT:  Head: Normocephalic and atraumatic.  Right Ear: Tympanic membrane, external ear and ear canal normal.  Left Ear: Tympanic membrane, external ear and ear canal normal.  Nose: Nose normal. No mucosal edema or rhinorrhea.  Mouth/Throat: Uvula is midline, oropharynx is clear and moist and mucous membranes are normal. No posterior oropharyngeal erythema.  Eyes: Pupils are equal, round, and reactive to light. Conjunctivae and EOM are normal. Right eye exhibits no discharge. Left eye exhibits no discharge. No scleral icterus.  Neck: Normal range of motion. Neck supple. No thyromegaly present.  Cardiovascular: Normal rate, regular rhythm and intact distal pulses.  Murmur heard.  Decrescendo systolic (LUSB, suspect flow murmur from mild  dehydration - only had coffee this a.m.) murmur is present with a grade of 1/6. Pulmonary/Chest: Effort normal and breath sounds normal. No respiratory distress.  Abdominal: Soft. Bowel sounds are normal. There is no tenderness.  Musculoskeletal: She exhibits no edema.  Lymphadenopathy:    She has no cervical adenopathy.  Neurological: She is alert and oriented to person, place, and time. She has normal reflexes.  Skin: Skin is warm and dry. She is not diaphoretic. No erythema.  Psychiatric: She has a normal mood and affect. Her behavior is normal.    POC  TESTING Office Visit on 02/23/2018  Component Date Value Ref Range Status  . Color, UA 02/23/2018 yellow  yellow Final  . Clarity, UA 02/23/2018 clear  clear Final  . Glucose, UA 02/23/2018 negative  negative mg/dL Final  . Bilirubin, UA 02/23/2018 negative  negative Final  . Ketones, POC UA 02/23/2018 negative  negative mg/dL Final  . Spec Grav, UA 02/23/2018 1.025  1.010 - 1.025 Final  . Blood, UA 02/23/2018 trace-lysed* negative Final  . pH, UA 02/23/2018 5.5  5.0 - 8.0 Final  . Protein Ur, POC 02/23/2018 negative  negative mg/dL Final  . Urobilinogen, UA 02/23/2018 0.2  0.2 or 1.0 E.U./dL Final  . Nitrite, UA 02/23/2018 Negative  Negative Final  . Leukocytes, UA 02/23/2018 Negative  Negative Final    EKG: NSR, no acute ischemic changes noted. No significant change noted when compared to prior EKG done 10/20/2013.   I have personally reviewed the EKG tracing and agree with the computer interpretation with the exception of possible pulmonary disease Sinus  Rhythm  Low voltage -possible pulmonary disease.   ABNORMAL   Assessment & Plan:   1. Annual physical exam - gets reg mammogram, pap, sti screens through gyn Dr. Corinna Capra  2. Screening for cardiovascular, respiratory, and genitourinary diseases   3. Alternating constipation and diarrhea   4. Abdominal bloating with cramps - has appt w/ [facility] soon - try peppermint oil triple  coated, encouraged pt to keep food/sxs diary/log for time until GI visit and bring with her to appt to [facility]ie in dx.  5. History of UTI   6. Undiagnosed cardiac murmurs - suspect benign, ekg reassuring   Pt would like to wait to get any additional labs until we have received copies of what she recently had done at [facility] - will sign a release so we can get this.   Patient will continue on current chronic medications other than changes noted above, so ok to refill when needed.   Reviewed all health maintenance recommendations per USPSTF guidelines.   See after visit summary for patient specific instructions.  Orders Placed This Encounter  Procedures  . Tdap vaccine greater than or equal to 7yo IM  . POCT urinalysis dipstick  . EKG 12-Lead     Patient verbalized to me that they understand the following: diagnosis, what is being done for them, what to expect and what should be done at home.  Their questions have been answered. They understand that I am unable to predict every possible medication interaction or adverse outcome and that if any unexpected symptoms arise, they should contact us and their pharmacist, as well as never hesitate to seek urgent/emergent care at [facility] or ER if they think it might be warranted.    Delman Cheadle, MD, MPH [facility] [place] [address] [place], [place]  [phone] Office phone  [phone] Office fax   02/23/18 9:19 AM

## 2018-02-27 ENCOUNTER — Telehealth: Payer: Self-pay | Admitting: Family Medicine

## 2018-02-27 NOTE — Telephone Encounter (Signed)
Received records from [facility] on [employer]

## 2018-03-03 DIAGNOSIS — K639 Disease of intestine, unspecified: Secondary | ICD-10-CM | POA: Diagnosis not present

## 2018-04-01 HISTORY — PX: OTHER SURGICAL HISTORY: SHX169

## 2018-05-26 ENCOUNTER — Encounter: Payer: Self-pay | Admitting: Family Medicine

## 2018-05-26 LAB — HM COLONOSCOPY

## 2018-06-20 ENCOUNTER — Encounter (HOSPITAL_BASED_OUTPATIENT_CLINIC_OR_DEPARTMENT_OTHER): Payer: Self-pay | Admitting: Emergency Medicine

## 2018-06-20 ENCOUNTER — Other Ambulatory Visit: Payer: Self-pay

## 2018-06-20 ENCOUNTER — Emergency Department (HOSPITAL_BASED_OUTPATIENT_CLINIC_OR_DEPARTMENT_OTHER)
Admission: EM | Admit: 2018-06-20 | Discharge: 2018-06-20 | Disposition: A | Discharge status: Home / Self Care | Payer: 59 | Attending: Emergency Medicine | Admitting: Emergency Medicine

## 2018-06-20 DIAGNOSIS — Z79899 Other long term (current) drug therapy: Secondary | ICD-10-CM | POA: Insufficient documentation

## 2018-06-20 DIAGNOSIS — Y929 Unspecified place or not applicable: Secondary | ICD-10-CM | POA: Diagnosis not present

## 2018-06-20 DIAGNOSIS — Y939 Activity, unspecified: Secondary | ICD-10-CM | POA: Diagnosis not present

## 2018-06-20 DIAGNOSIS — S01511A Laceration without foreign body of lip, initial encounter: Secondary | ICD-10-CM | POA: Insufficient documentation

## 2018-06-20 DIAGNOSIS — Y999 Unspecified external cause status: Secondary | ICD-10-CM | POA: Diagnosis not present

## 2018-06-20 DIAGNOSIS — S0181XA Laceration without foreign body of other part of head, initial encounter: Secondary | ICD-10-CM

## 2018-06-20 DIAGNOSIS — S01512A Laceration without foreign body of oral cavity, initial encounter: Secondary | ICD-10-CM

## 2018-06-20 DIAGNOSIS — W228XXA Striking against or struck by other objects, initial encounter: Secondary | ICD-10-CM | POA: Diagnosis not present

## 2018-06-20 MED ORDER — LIDOCAINE-EPINEPHRINE 2 %-1:100000 IJ SOLN
10.0000 mL | Freq: Once | INTRAMUSCULAR | Status: DC
  Filled 2018-06-20: qty 10.2

## 2018-06-20 MED ORDER — BACITRACIN ZINC 500 UNIT/GM EX OINT
TOPICAL_OINTMENT | Freq: Once | CUTANEOUS | Status: AC
  Administered 2018-06-20: 16:00:00 via TOPICAL

## 2018-06-20 MED ORDER — CHLORHEXIDINE GLUCONATE 0.12 % MT SOLN: 15.0000 mL | Freq: Two times a day (BID) | OROMUCOSAL | 0 refills | Status: DC

## 2018-06-20 MED ORDER — LIDOCAINE-EPINEPHRINE (PF) 2 %-1:200000 IJ SOLN
INTRAMUSCULAR | Status: AC
  Administered 2018-06-20: 16:00:00
  Filled 2018-06-20: qty 10

## 2018-06-20 NOTE — ED Provider Notes (Signed)
[facility] EMERGENCY DEPARTMENT Provider Note   CSN: [id number] Arrival date & time: 06/20/18  1421    History   Chief Complaint Chief Complaint  Patient presents with  . Laceration    HPI Margaret Small is a 48 y.o. female resents for evaluation of laceration to the chin that occurred just 30 minutes prior to ED arrival.  Patient states that she was using a bungee cord when it snapped back and hit her face.  Patient states that her tetanus is up-to-date.     The history is provided by the patient.    Past Medical History:  Diagnosis Date  . Mass of joint of left wrist     Patient Active Problem List   Diagnosis Date Noted  . Ganglion of left wrist 09/17/2017    Past Surgical History:  Procedure Laterality Date  . APPENDECTOMY    . BACK SURGERY    . MASS EXCISION Left 10/14/2017   Procedure: EXCISION CYST LEFT WRIST VOLAR;  Surgeon: Daryll Brod, MD;  Location: [facility];  Service: Orthopedics;  Laterality: Left;  REG/UPPERARM     OB History   No obstetric history on file.      Home Medications    Prior to Admission medications   Medication Sig Start Date End Date Taking? Authorizing Provider  cetirizine (ZYRTEC) 10 MG tablet Take 10 mg by mouth daily as needed for allergies.    [provider]  chlorhexidine (PERIDEX) 0.12 % solution Use as directed 15 mLs in the mouth or throat 2 (two) times daily. 06/20/18   Volanda Napoleon, PA-C  ibuprofen (ADVIL,MOTRIN) 800 MG tablet Take 1 tablet (800 mg total) by mouth every 8 (eight) hours as needed for moderate pain. 10/20/13   Orlie Dakin, MD  Norethin Ace-Eth Estrad-FE (MINASTRIN 24 FE PO) Take 1 tablet by mouth daily.    [provider]  triamcinolone (NASACORT) 55 MCG/ACT AERO nasal inhaler Place 2 sprays into the nose daily. 02/23/18   Shawnee Knapp, MD    Family History No family history on file.  Social History Social History   Tobacco Use  . Smoking  status: Never Smoker  . Smokeless tobacco: Never Used  Substance Use Topics  . Alcohol use: Yes    Comment: social  . Drug use: No     Allergies   Macrobid [nitrofurantoin] and Sulfa antibiotics   Review of Systems Review of Systems  Skin: Positive for wound.  All other systems reviewed and are negative.    Physical Exam Updated Vital Signs BP (!) 134/98 (BP Location: Left Arm)   Pulse 79   Temp 98 F (36.7 C) (Oral)   Resp 16   Ht 5\' 6"  (1.676 m)   Wt 77.1 kg   SpO2 99%   BMI 27.44 kg/m   Physical Exam Vitals signs and nursing note reviewed.  Constitutional:      Appearance: She is well-developed.  HENT:     Head: Normocephalic.      Comments: Small 0.25 laceration noted to the anterior lip on the right lower side.  It does go through and through to the outside laceration.    Mouth/Throat:     Mouth: Lacerations present.     Comments: Dentition appears intact. Eyes:     General: No scleral icterus.       Right eye: No discharge.        Left eye: No discharge.     Conjunctiva/sclera: Conjunctivae  normal.  Pulmonary:     Effort: Pulmonary effort is normal.  Skin:    General: Skin is warm and dry.  Neurological:     Mental Status: She is alert.  Psychiatric:        Speech: Speech normal.        Behavior: Behavior normal.        ED Treatments / Results  Labs (all labs ordered are listed, but only abnormal results are displayed) Labs Reviewed - No data to display  EKG None  Radiology No results found.  Procedures .Marland KitchenLaceration Repair Date/Time: 06/20/2018 3:35 PM Performed by: Volanda Napoleon, PA-C Authorized by: Volanda Napoleon, PA-C   Consent:    Consent obtained:  Verbal   Consent given by:  Patient   Risks discussed:  Infection, need for additional repair, pain, poor cosmetic result and poor wound healing   Alternatives discussed:  No treatment and delayed treatment Universal protocol:    Procedure explained and questions  answered to patient or proxy's satisfaction: yes     Relevant documents present and verified: yes     Test results available and properly labeled: yes     Imaging studies available: yes     Required blood products, implants, devices, and special equipment available: yes     Site/side marked: yes     Immediately prior to procedure, a time out was called: yes     Patient identity confirmed:  Verbally with patient Anesthesia (see MAR for exact dosages):    Anesthesia method:  Local infiltration   Local anesthetic:  Lidocaine 2% WITH epi Laceration details:    Location:  Face   Face location:  Chin   Length (cm):  1.5 Repair type:    Repair type:  Simple Pre-procedure details:    Preparation:  Patient was prepped and draped in usual sterile fashion Exploration:    Hemostasis achieved with:  Direct pressure   Wound extent: no foreign bodies/material noted   Treatment:    Area cleansed with:  Betadine   Amount of cleaning:  Extensive   Irrigation solution:  Sterile saline   Irrigation method:  Syringe   Visualized foreign bodies/material removed: no   Skin repair:    Repair method:  Sutures   Suture size:  5-0   Suture material:  Fast-absorbing gut   Suture technique:  Simple interrupted   Number of sutures:  6 Approximation:    Approximation:  Close Post-procedure details:    Dressing:  Antibiotic ointment and non-adherent dressing   Patient tolerance of procedure:  Tolerated well, no immediate complications Comments:     Wound was anesthetized, was thoroughly and extensively irrigated with sterile saline.  Review of the wound showed no evidence of foreign body.  The laceration did go through and through to the small laceration on the anterior lower lip.   (including critical care time)  Medications Ordered in ED Medications  lidocaine-EPINEPHrine (XYLOCAINE W/EPI) 2 %-1:100000 (with pres) injection 10 mL (0 mLs Intradermal Hold 06/20/18 1456)  lidocaine-EPINEPHrine  (XYLOCAINE W/EPI) 2 %-1:200000 (PF) injection (  Given by Other 06/20/18 1531)  bacitracin ointment ( Topical Given 06/20/18 1601)     Initial Impression / Assessment and Plan / ED Course  I have reviewed the triage vital signs and the nursing notes.  Pertinent labs & imaging results that were available during my care of the patient were reviewed by me and considered in my medical decision making (see chart for details).  48 year old female who presents for evaluation of laceration to chin that occurred just 30 minutes prior to ED arrival.  She reports that she was using a bungee cord and it snapped back and hit her in the face.  Her tetanus is up-to-date. Patient is afebrile, non-toxic appearing, sitting comfortably on examination table. Vital signs reviewed and stable.  On exam, she is a 1.5 cm stellate laceration noted to the right chin.  He also has a small laceration the anterior aspect of her lower lip on the right side that does go through and through.  We will plan to repair the outside laceration.  Laceration repaired as documented above.  Patient tolerated procedure well.  The wound on the outside chin was repaired.  The small laceration noted in the anterior lip was left open.  Will plan to send patient home with Peridex mouthwash. Encouraged at home supportive care measures. Patient had ample opportunity for questions and discussion. All patient's questions were answered with full understanding. Strict return precautions discussed. Patient expresses understanding and agreement to plan.   Portions of this note were generated with Lobbyist. Dictation errors may occur despite best attempts at proofreading.   Final Clinical Impressions(s) / ED Diagnoses   Final diagnoses:  Facial laceration, initial encounter  Laceration of internal mouth, initial encounter    ED Discharge Orders         Ordered    chlorhexidine (PERIDEX) 0.12 % solution  2 times daily      06/20/18 1539           Desma Mcgregor 06/20/18 Zola Button, MD 06/21/18 [phone]

## 2018-06-20 NOTE — ED Triage Notes (Signed)
Laceration to chin from a piece of exercise equipment.

## 2018-06-20 NOTE — Discharge Instructions (Signed)
Keep the wound clean and dry for the first 24 hours. After that you may gently clean the wound with soap and water. Make sure to pat dry the wound before covering it with any dressing. You can use topical antibiotic ointment and bandage. Ice and elevate for pain relief.   You can take Tylenol or Ibuprofen as directed for pain. You can alternate Tylenol and Ibuprofen every 4 hours for additional pain relief.   Use Peridex 3 times a day as directed.  Make sure that you are ensuring no food gets stuck in the laceration on the inside of the mouth.  In about 5 days, you will tug at the tail ends.  If you meet resistance, let them stay for 1 more day and try again.  If they are easily able to come out, remove the tail.  You should try on the 60 to remove them again.  If you meet resistance, wait and try again on 7-day.  They should not stay in more than 7 days.  After the stitches have been removed, you can apply Mederma to help with scarring.  Monitor closely for any signs of infection. Return to the Emergency Department for any worsening redness/swelling of the area that begins to spread, drainage from the site, worsening pain, fever or any other worsening or concerning symptoms.

## 2018-06-23 NOTE — Progress Notes (Signed)
Polypoid colonic mucosa neg for dysplasia

## 2018-08-12 ENCOUNTER — Other Ambulatory Visit: Payer: Self-pay | Admitting: Obstetrics and Gynecology

## 2018-10-06 ENCOUNTER — Other Ambulatory Visit: Payer: Self-pay | Admitting: *Deleted

## 2018-10-06 DIAGNOSIS — Z20822 Contact with and (suspected) exposure to covid-19: Secondary | ICD-10-CM

## 2018-10-12 LAB — NOVEL CORONAVIRUS, NAA: SARS-CoV-2, NAA: NOT DETECTED

## 2019-05-10 ENCOUNTER — Other Ambulatory Visit: Payer: Self-pay

## 2019-05-10 ENCOUNTER — Encounter: Payer: Self-pay | Admitting: Family

## 2019-05-10 ENCOUNTER — Ambulatory Visit (HOSPITAL_BASED_OUTPATIENT_CLINIC_OR_DEPARTMENT_OTHER)
Admission: RE | Admit: 2019-05-10 | Discharge: 2019-05-10 | Disposition: A | Discharge status: Home / Self Care | Payer: Managed Care, Other (non HMO) | Source: Ambulatory Visit | Attending: Family | Admitting: Family

## 2019-05-10 ENCOUNTER — Ambulatory Visit (INDEPENDENT_AMBULATORY_CARE_PROVIDER_SITE_OTHER): Payer: Managed Care, Other (non HMO) | Admitting: Family

## 2019-05-10 ENCOUNTER — Telehealth: Payer: Self-pay | Admitting: Family

## 2019-05-10 ENCOUNTER — Encounter (HOSPITAL_BASED_OUTPATIENT_CLINIC_OR_DEPARTMENT_OTHER): Payer: Self-pay

## 2019-05-10 VITALS — BP 126/91 | HR 73 | Temp 97.6°F | Resp 16 | Ht 66.0 in | Wt 168.0 lb

## 2019-05-10 DIAGNOSIS — R0602 Shortness of breath: Secondary | ICD-10-CM | POA: Diagnosis not present

## 2019-05-10 DIAGNOSIS — R0789 Other chest pain: Secondary | ICD-10-CM

## 2019-05-10 MED ORDER — IOHEXOL 350 MG/ML SOLN
100.0000 mL | Freq: Once | INTRAVENOUS | Status: AC | PRN
  Administered 2019-05-10: 100 mL via INTRAVENOUS

## 2019-05-10 MED ORDER — IOHEXOL 300 MG/ML  SOLN: 100.0000 mL | Freq: Once | INTRAMUSCULAR | Status: DC | PRN

## 2019-05-10 NOTE — Telephone Encounter (Signed)
Reviewed CT results and EKG results with pt. Will refer for 2D echo. Pt is advised to let me know if symptoms do not improve in the next few weeks and I will place a referral to cardiology.

## 2019-05-10 NOTE — Progress Notes (Signed)
Virtual Visit via Video Note  I connected with Margaret Small on 05/10/19 at  9:00 AM EST by a video enabled telemedicine application and verified that I am speaking with the correct person using two identifiers.  Location: Patient: home Provider: home   I discussed the limitations of evaluation and management by telemedicine and the availability of in person appointments. The patient expressed understanding and agreed to proceed.  History of Present Illness:  Patient is a 49 yr old female who presents today with chief complaint of SOB, and c/o some chest pressure. These symptoms began when she contracted covid-19 in the end of December. When she had COVID she reports that her primary symptoms were fatigue/loss of smell. Reports that ever since then her chest has felt tight and back is achy.  She is taking a baby aspirin once a day.  "I just haven't felt right since I had ".  Reports that she can go for a little walk but becomes easily winded. Feels tightness under her rib cage. This is not made worse by exertion. Sometimes she notes some right sided chest pain.   Past Medical History:  Diagnosis Date  . Mass of joint of left wrist      Social History   Socioeconomic History  . Marital status: Married    Spouse name: Not on file  . Number of children: Not on file  . Years of education: Not on file  . Highest education level: Not on file  Occupational History  . Not on file  Tobacco Use  . Smoking status: Never Smoker  . Smokeless tobacco: Never Used  Substance and Sexual Activity  . Alcohol use: Yes    Comment: social  . Drug use: No  . Sexual activity: Not on file  Other Topics Concern  . 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 Difficulty of Paying Living Expenses: Not on [place]:   . Worried About Charity fundraiser in the Last Year: Not on file  . Ran Out of Food in the Last Year: Not on file  Transportation Needs:   . Lack of Transportation (Medical): Not on file  . Lack of Transportation (Non-Medical): Not on file  Physical Activity:   . Days of Exercise per Week: Not on file  . Minutes of Exercise per Session: Not on file  Stress:   . Feeling of Stress : Not on file  Social Connections:   . Frequency of Communication with Friends and Family: Not on file  . Frequency of Social Gatherings with Friends and Family: Not on file  . Attends Religious Services: Not on file  . Active Member of Clubs or Organizations: Not on file  . Attends Archivist Meetings: Not on file  . Marital Status: Not on file  Intimate Partner Violence:   . Fear of Current or Ex-Partner: Not on file  . Emotionally Abused: Not on file  . Physically Abused: Not on file  . Sexually Abused: Not on file    Past Surgical History:  Procedure Laterality Date  . APPENDECTOMY    . BACK SURGERY    . hystectomy  2020  . MASS EXCISION Left 10/14/2017   Procedure: EXCISION CYST LEFT  WRIST VOLAR;  Surgeon: Daryll Brod, MD;  Location: [facility];  Service: Orthopedics;  Laterality: Left;  REG/UPPERARM    Family History  Problem Relation Age of Onset  . Atrial fibrillation Mother   . Diabetes Mellitus II Mother   . Hypertension Mother   . Obesity Father        ? massive stroke   . Hypertension Father   . Hypertension Brother   . Diabetes Mellitus II Maternal Grandmother   . Diabetes Mellitus II Maternal Grandfather   . CAD Maternal Grandfather   . Colon cancer Paternal Grandmother   . Brain cancer Paternal Grandmother   . Throat cancer Paternal Grandfather     Allergies  Allergen Reactions  . Macrobid [Nitrofurantoin] Other (See Comments)    Myalgias, flu-like symptoms  . Sulfa Antibiotics Rash    Current Outpatient Medications on File Prior  to Visit  Medication Sig Dispense Refill  . ibuprofen (ADVIL,MOTRIN) 800 MG tablet Take 1 tablet (800 mg total) by mouth every 8 (eight) hours as needed for moderate pain. 21 tablet 0   No current facility-administered medications on file prior to visit.    BP (!) 126/91 (BP Location: Right Arm, Patient Position: Sitting, Cuff Size: Small)   Pulse 73   Temp 97.6 F (36.4 C) (Temporal)   Resp 16   Ht 5\' 6"  (1.676 m)   Wt 168 lb (76.2 kg)   LMP 09/30/2017   SpO2 100%   BMI 27.12 kg/m      Observations/Objective:   Gen: Awake, alert, no acute distress Resp: Breathing is even and non-labored Psych: calm/pleasant demeanor Neuro: Alert and Oriented x 3, + facial symmetry, speech is clear.   Assessment and Plan:  SOB/Atypical chest pain- EKG tracing is personally reviewed.  EKG notes NSR.  No acute changes. Will also obtain a CT angio to rule out PE and PNA.  She is advised to go to the ER if she develops severe/worsening chest pain. If work up negative and SOB persists would consider 2D echo.  Provider visit was performed virtually today but the patient came in for nurse visit to complete an EKG and vitals.    30 minutes spent on today's visit.  Follow Up Instructions:    I discussed the assessment and treatment plan with the patient. The patient was provided an opportunity to ask questions and all were answered. The patient agreed with the plan and demonstrated an understanding of the instructions.   The patient was advised to call back or seek an in-person evaluation if the symptoms worsen or if the condition fails to improve as anticipated.  Nance Pear, NP

## 2019-05-25 ENCOUNTER — Ambulatory Visit (HOSPITAL_BASED_OUTPATIENT_CLINIC_OR_DEPARTMENT_OTHER)
Admission: RE | Admit: 2019-05-25 | Discharge: 2019-05-25 | Disposition: A | Discharge status: Home / Self Care | Payer: Managed Care, Other (non HMO) | Source: Ambulatory Visit | Attending: Family | Admitting: Family

## 2019-05-25 ENCOUNTER — Other Ambulatory Visit: Payer: Self-pay

## 2019-05-25 DIAGNOSIS — R0602 Shortness of breath: Secondary | ICD-10-CM | POA: Diagnosis present

## 2019-05-25 NOTE — Progress Notes (Signed)
  Echocardiogram 2D Echocardiogram has been performed.  Margaret Small 05/25/2019, 1:47 PM

## 2019-05-27 ENCOUNTER — Encounter: Payer: Self-pay | Admitting: Family

## 2019-06-08 ENCOUNTER — Other Ambulatory Visit: Payer: Self-pay | Admitting: Obstetrics and Gynecology

## 2019-06-08 DIAGNOSIS — R928 Other abnormal and inconclusive findings on diagnostic imaging of breast: Secondary | ICD-10-CM

## 2019-06-08 LAB — HM PAP SMEAR

## 2019-06-22 ENCOUNTER — Ambulatory Visit
Admission: RE | Admit: 2019-06-22 | Discharge: 2019-06-22 | Disposition: A | Discharge status: Home / Self Care | Payer: Managed Care, Other (non HMO) | Source: Ambulatory Visit | Attending: Obstetrics and Gynecology | Admitting: Obstetrics and Gynecology

## 2019-06-22 ENCOUNTER — Other Ambulatory Visit: Payer: Self-pay

## 2019-06-22 ENCOUNTER — Other Ambulatory Visit: Payer: Self-pay | Admitting: Obstetrics and Gynecology

## 2019-06-22 DIAGNOSIS — R928 Other abnormal and inconclusive findings on diagnostic imaging of breast: Secondary | ICD-10-CM

## 2019-06-22 LAB — HM MAMMOGRAPHY

## 2019-07-02 ENCOUNTER — Other Ambulatory Visit: Payer: Self-pay | Admitting: Obstetrics and Gynecology

## 2019-07-02 DIAGNOSIS — N6002 Solitary cyst of left breast: Secondary | ICD-10-CM

## 2019-07-12 ENCOUNTER — Other Ambulatory Visit: Payer: Self-pay

## 2019-07-13 ENCOUNTER — Ambulatory Visit: Payer: 59 | Admitting: Family

## 2019-07-13 ENCOUNTER — Other Ambulatory Visit: Payer: Self-pay

## 2019-07-13 ENCOUNTER — Encounter: Payer: Self-pay | Admitting: Family

## 2019-07-13 VITALS — BP 121/82 | HR 76 | Temp 97.7°F | Resp 16 | Ht 66.0 in | Wt 162.0 lb

## 2019-07-13 DIAGNOSIS — R109 Unspecified abdominal pain: Secondary | ICD-10-CM

## 2019-07-13 LAB — POC URINALSYSI DIPSTICK (AUTOMATED)
Bilirubin, UA: NEGATIVE
Glucose, UA: NEGATIVE
Leukocytes, UA: NEGATIVE
Nitrite, UA: NEGATIVE
Protein, UA: NEGATIVE
Spec Grav, UA: 1.02 (ref 1.010–1.025)
Urobilinogen, UA: NEGATIVE E.U./dL — AB
pH, UA: 5.5 (ref 5.0–8.0)

## 2019-07-13 NOTE — Patient Instructions (Addendum)
We are sending your urine for culture.   Please complete CT scan for further evaluation of your abdominal pain. (You should be contacted about scheduling this appointment). To help with constipation- please continue probiotics daily, add metamucil once daily (capsules, wafers or powders- your preference). Go to the ER if you develop severe/worsening abdominal pain.

## 2019-07-13 NOTE — Progress Notes (Signed)
Subjective:    Patient ID: Margaret Small, female    DOB: 07-21-1970, 49 y.o.   MRN: [id number]  HPI  Patient is a 49 yr old female who presents today to establish care.  She was previously seen by Dr. Marijean Heath for primary care.  We did see her for an acute visit on 05/10/19 with atypical chest pain.  She underwent a CTA which was negative for PE.  Denies CP/SOB since last visit. She has been walking regularly without any difficulty.  Reports that 3 weeks ago she had severe left sided abdominal pain.   Went to Urgent care.  Had KUB- was told that she had constipation. Pain resolved spontaneously but then returned as a tenderness in the left side of her abdomen. Reports that she had a small BM today.  Denies dysuria, urinary frequency or hematuria. Denies nausea/vomitting. Drank a bottle of mag citrate which did help her to move her bowels. Not sure if this helped her pain but she did move her bowels.     Review of Systems    see HPI  Past Medical History:  Diagnosis Date  . Mass of joint of left wrist      Social History   Socioeconomic History  . Marital status: Married    Spouse name: [name]  . Number of children: 2  . Years of education: Not on file  . Highest education level: Not on file  Occupational History  . Occupation: Doctor, general practice  Tobacco Use  . Smoking status: Never Smoker  . Smokeless tobacco: Never Used  Substance and Sexual Activity  . Alcohol use: Yes    Comment: social  . Drug use: No  . Sexual activity: Yes    Partners: Male  Other Topics Concern  . 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 Difficulty of Paying Living Expenses:   Food Insecurity:   . Worried About Charity fundraiser in the Last Year:   . Arboriculturist in the Last Year:   Transportation  Needs:   . Film/video editor (Medical):   Marland Kitchen Lack of Transportation (Non-Medical):   Physical Activity: Insufficiently Active  . Days of Exercise per Week: 3 days  . Minutes of Exercise per Session: 40 min  Stress:   . Feeling of Stress :   Social Connections:   . Frequency of Communication with Friends and Family:   . Frequency of Social Gatherings with Friends and Family:   . Attends Religious Services:   . Active Member of Clubs or Organizations:   . Attends Archivist Meetings:   Marland Kitchen Marital Status:   Intimate Partner Violence:   . Fear of Current or Ex-Partner:   . Emotionally Abused:   Marland Kitchen Physically Abused:   . Sexually Abused:     Past Surgical History:  Procedure Laterality Date  . APPENDECTOMY    . BACK SURGERY    . hystectomy  2020  . MASS EXCISION Left 10/14/2017   Procedure: EXCISION CYST LEFT WRIST VOLAR;  Surgeon: Daryll Brod, MD;  Location: [facility];  Service: Orthopedics;  Laterality: Left;  REG/UPPERARM    Family History  Problem Relation Age of Onset  . Atrial fibrillation Mother   .  Diabetes Mellitus II Mother   . Hypertension Mother   . Obesity Father        ? massive stroke   . Hypertension Father   . Hypertension Brother   . Diabetes Mellitus II Maternal Grandmother   . Diabetes Mellitus II Maternal Grandfather   . CAD Maternal Grandfather   . Colon cancer Paternal Grandmother   . Brain cancer Paternal Grandmother   . Throat cancer Paternal Grandfather   . Asthma Daughter     Allergies  Allergen Reactions  . Macrobid [Nitrofurantoin] Other (See Comments)    Myalgias, flu-like symptoms  . Sulfa Antibiotics Rash    Current Outpatient Medications on File Prior to Visit  Medication Sig Dispense Refill  . ibuprofen (ADVIL,MOTRIN) 800 MG tablet Take 1 tablet (800 mg total) by mouth every 8 (eight) hours as needed for moderate pain. 21 tablet 0   No current facility-administered medications on file prior to visit.      BP 121/82 (BP Location: Right Arm, Patient Position: Sitting, Cuff Size: Small)   Pulse 76   Temp 97.7 F (36.5 C) (Temporal)   Resp 16   Ht 5\' 6"  (1.676 m)   Wt 162 lb (73.5 kg)   LMP 09/30/2017   SpO2 100%   BMI 26.15 kg/m    Objective:   Physical Exam Constitutional:      Appearance: She is well-developed.  Neck:     Thyroid: No thyromegaly.  Cardiovascular:     Rate and Rhythm: Normal rate and regular rhythm.     Heart sounds: Normal heart sounds. No murmur.  Pulmonary:     Effort: Pulmonary effort is normal. No respiratory distress.     Breath sounds: Normal breath sounds. No wheezing.  Abdominal:     General: There is no distension.     Palpations: Abdomen is soft. There is no mass.     Tenderness: There is abdominal tenderness (left sided abdominal tenderness with guarding). There is no right CVA tenderness or left CVA tenderness.  Musculoskeletal:     Cervical back: Neck supple.  Skin:    General: Skin is warm and dry.  Neurological:     Mental Status: She is alert and oriented to person, place, and time.  Psychiatric:        Behavior: Behavior normal.        Thought Content: Thought content normal.        Judgment: Judgment normal.           Assessment & Plan:  Abdominal pain- UA is performed today and notes trace blood.  Differential includes constipation, colitis, kidney stone, ovarian cyst.  Will obtain a CT abd/pelvis to further evaluate. She is unable to complete today due to a prior appointment. Would like to complete tomorrow.   This visit occurred during the SARS-CoV-2 public health emergency.  Safety protocols were in place, including screening questions prior to the visit, additional usage of staff PPE, and extensive cleaning of exam room while observing appropriate contact time as indicated for disinfecting solutions.

## 2019-07-14 ENCOUNTER — Encounter (HOSPITAL_BASED_OUTPATIENT_CLINIC_OR_DEPARTMENT_OTHER): Payer: Self-pay

## 2019-07-14 ENCOUNTER — Ambulatory Visit (HOSPITAL_BASED_OUTPATIENT_CLINIC_OR_DEPARTMENT_OTHER)
Admission: RE | Admit: 2019-07-14 | Discharge: 2019-07-14 | Disposition: A | Discharge status: Home / Self Care | Payer: 59 | Source: Ambulatory Visit | Attending: Family | Admitting: Family

## 2019-07-14 DIAGNOSIS — R109 Unspecified abdominal pain: Secondary | ICD-10-CM | POA: Diagnosis present

## 2019-07-14 MED ORDER — IOHEXOL 300 MG/ML  SOLN
100.0000 mL | Freq: Once | INTRAMUSCULAR | Status: AC | PRN
  Administered 2019-07-14: 100 mL via INTRAVENOUS

## 2019-07-15 ENCOUNTER — Telehealth: Payer: Self-pay | Admitting: Family

## 2019-07-15 DIAGNOSIS — N83202 Unspecified ovarian cyst, left side: Secondary | ICD-10-CM

## 2019-07-15 NOTE — Telephone Encounter (Signed)
CT shows what appears to be a left sided ovarian cyst.  Radiologist is recommending [place] to further evaluate.  I have placed [place] order.

## 2019-07-15 NOTE — Telephone Encounter (Signed)
Patient advised of results and provider's recommendations.  

## 2019-07-19 ENCOUNTER — Ambulatory Visit (HOSPITAL_BASED_OUTPATIENT_CLINIC_OR_DEPARTMENT_OTHER)
Admission: RE | Admit: 2019-07-19 | Discharge: 2019-07-19 | Disposition: A | Discharge status: Home / Self Care | Payer: 59 | Source: Ambulatory Visit | Attending: Family | Admitting: Family

## 2019-07-19 ENCOUNTER — Other Ambulatory Visit: Payer: Self-pay

## 2019-07-19 DIAGNOSIS — N83202 Unspecified ovarian cyst, left side: Secondary | ICD-10-CM | POA: Diagnosis not present

## 2019-07-21 ENCOUNTER — Telehealth: Payer: Self-pay | Admitting: Family

## 2019-07-21 DIAGNOSIS — N83299 Other ovarian cyst, unspecified side: Secondary | ICD-10-CM

## 2019-07-21 NOTE — Telephone Encounter (Signed)
Patient calling about results, she review results in Mychart and will call GYN for follow up.  Please advise of findings.

## 2019-07-21 NOTE — Telephone Encounter (Signed)
Reviewed results with pt. Referral to her gyn Dr. Corinna Capra placed.

## 2019-07-21 NOTE — Telephone Encounter (Signed)
CallerKevisha Sobon  Call Back # [phone]  Subject : Results  Pt is calling in regards to test results

## 2019-07-26 ENCOUNTER — Encounter: Payer: Self-pay | Admitting: Family

## 2019-08-10 ENCOUNTER — Encounter: Payer: 59 | Admitting: Family

## 2019-08-17 ENCOUNTER — Ambulatory Visit (INDEPENDENT_AMBULATORY_CARE_PROVIDER_SITE_OTHER): Payer: 59 | Admitting: Family

## 2019-08-17 ENCOUNTER — Telehealth: Payer: Self-pay | Admitting: Family

## 2019-08-17 ENCOUNTER — Other Ambulatory Visit: Payer: Self-pay

## 2019-08-17 ENCOUNTER — Encounter: Payer: Self-pay | Admitting: Family

## 2019-08-17 VITALS — BP 117/87 | HR 76 | Temp 97.6°F | Resp 16 | Ht 66.0 in | Wt 158.0 lb

## 2019-08-17 DIAGNOSIS — Z0000 Encounter for general adult medical examination without abnormal findings: Secondary | ICD-10-CM

## 2019-08-17 NOTE — Telephone Encounter (Signed)
Spoke with Jinny Blossom and she will fax report to nurse fax to Ruth's attn.

## 2019-08-17 NOTE — Telephone Encounter (Signed)
You please call Dr. Gregor Hams office and request mammogram report?

## 2019-08-17 NOTE — Patient Instructions (Signed)
Please complete lab work prior to leaving.    Preventive Care 40-51 Years Old, Female Preventive care refers to visits with your health care provider and lifestyle choices that can promote health and wellness. This includes:  A yearly physical exam. This may also be called an annual well check.  Regular dental visits and eye exams.  Immunizations.  Screening for certain conditions.  Healthy lifestyle choices, such as eating a healthy diet, getting regular exercise, not using drugs or products that contain nicotine and tobacco, and limiting alcohol use. What can I expect for my preventive care visit? Physical exam Your health care provider will check your:  Height and weight. This may be used to calculate body mass index (BMI), which tells if you are at a healthy weight.  Heart rate and blood pressure.  Skin for abnormal spots. Counseling Your health care provider may ask you questions about your:  Alcohol, tobacco, and drug use.  Emotional well-being.  Home and relationship well-being.  Sexual activity.  Eating habits.  Work and work environment.  Method of birth control.  Menstrual cycle.  Pregnancy history. What immunizations do I need?  Influenza (flu) vaccine  This is recommended every year. Tetanus, diphtheria, and pertussis (Tdap) vaccine  You may need a Td booster every 10 years. Varicella (chickenpox) vaccine  You may need this if you have not been vaccinated. Zoster (shingles) vaccine  You may need this after age 60. Measles, mumps, and rubella (MMR) vaccine  You may need at least one dose of MMR if you were born in 1957 or later. You may also need a second dose. Pneumococcal conjugate (PCV13) vaccine  You may need this if you have certain conditions and were not previously vaccinated. Pneumococcal polysaccharide (PPSV23) vaccine  You may need one or two doses if you smoke cigarettes or if you have certain conditions. Meningococcal conjugate  (MenACWY) vaccine  You may need this if you have certain conditions. Hepatitis A vaccine  You may need this if you have certain conditions or if you travel or work in places where you may be exposed to hepatitis A. Hepatitis B vaccine  You may need this if you have certain conditions or if you travel or work in places where you may be exposed to hepatitis B. Haemophilus influenzae type b (Hib) vaccine  You may need this if you have certain conditions. Human papillomavirus (HPV) vaccine  If recommended by your health care provider, you may need three doses over 6 months. You may receive vaccines as individual doses or as more than one vaccine together in one shot (combination vaccines). Talk with your health care provider about the risks and benefits of combination vaccines. What tests do I need? Blood tests  Lipid and cholesterol levels. These may be checked every 5 years, or more frequently if you are over 50 years old.  Hepatitis C test.  Hepatitis B test. Screening  Lung cancer screening. You may have this screening every year starting at age 55 if you have a 30-pack-year history of smoking and currently smoke or have quit within the past 15 years.  Colorectal cancer screening. All adults should have this screening starting at age 50 and continuing until age 75. Your health care provider may recommend screening at age 45 if you are at increased risk. You will have tests every 1-10 years, depending on your results and the type of screening test.  Diabetes screening. This is done by checking your blood sugar (glucose) after you have   not eaten for a while (fasting). You may have this done every 1-3 years.  Mammogram. This may be done every 1-2 years. Talk with your health care provider about when you should start having regular mammograms. This may depend on whether you have a family history of breast cancer.  BRCA-related cancer screening. This may be done if you have a family  history of breast, ovarian, tubal, or peritoneal cancers.  Pelvic exam and Pap test. This may be done every 3 years starting at age 21. Starting at age 30, this may be done every 5 years if you have a Pap test in combination with an HPV test. Other tests  Sexually transmitted disease (STD) testing.  Bone density scan. This is done to screen for osteoporosis. You may have this scan if you are at high risk for osteoporosis. Follow these instructions at home: Eating and drinking  Eat a diet that includes fresh fruits and vegetables, whole grains, lean protein, and low-fat dairy.  Take vitamin and mineral supplements as recommended by your health care provider.  Do not drink alcohol if: ? Your health care provider tells you not to drink. ? You are pregnant, may be pregnant, or are planning to become pregnant.  If you drink alcohol: ? Limit how much you have to 0-1 drink a day. ? Be aware of how much alcohol is in your drink. In the [place] one drink equals one 12 oz bottle of beer (355 mL), one 5 oz glass of wine (148 mL), or one 1 oz glass of hard liquor (44 mL). Lifestyle  Take daily care of your teeth and gums.  Stay active. Exercise for at least 30 minutes on 5 or more days each week.  Do not use any products that contain nicotine or tobacco, such as cigarettes, e-cigarettes, and chewing tobacco. If you need help quitting, ask your health care provider.  If you are sexually active, practice safe sex. Use a condom or other form of birth control (contraception) in order to prevent pregnancy and STIs (sexually transmitted infections).  If told by your health care provider, take low-dose aspirin daily starting at age 50. What's next?  Visit your health care provider once a year for a well check visit.  Ask your health care provider how often you should have your eyes and teeth checked.  Stay up to date on all vaccines. This information is not intended to replace advice given to you  by your health care provider. Make sure you discuss any questions you have with your health care provider. Document Revised: 11/27/2017 Document Reviewed: 11/27/2017 Elsevier Patient Education  2020 Elsevier Inc.  

## 2019-08-17 NOTE — Progress Notes (Signed)
Subjective:    Patient ID: Margaret Small, female    DOB: May 22, 1970, 49 y.o.   MRN: [id number]  HPI  Patient presents today for complete physical.  Immunizations: 2019 tdap.  Completed covid vaccine, declines shingrix Diet: reports diet is healthy Exercise: walks 3+ times a week Colonoscopy:  05/26/18 Pap Smear:  hysterectomy Mammogram: 06/03/19 Dental: up to date Vision:  Up to date     Review of Systems  Constitutional: Negative for unexpected weight change.  HENT: Negative for hearing loss and rhinorrhea.   Eyes: Negative for visual disturbance.  Respiratory: Negative for cough and shortness of breath.   Cardiovascular: Negative for chest pain.  Gastrointestinal: Negative for constipation, diarrhea, nausea and vomiting.  Genitourinary: Negative for dysuria and hematuria.  Musculoskeletal: Negative for arthralgias and myalgias.  Skin: Negative for rash.  Neurological: Negative for headaches.  Hematological: Negative for adenopathy.  Psychiatric/Behavioral:       Denies depression/anxiety    Past Medical History:  Diagnosis Date  . Mass of joint of left wrist      Social History   Socioeconomic History  . Marital status: Married    Spouse name: [name]  . Number of children: 2  . Years of education: Not on file  . Highest education level: Not on file  Occupational History  . Occupation: Doctor, general practice  Tobacco Use  . Smoking status: Never Smoker  . Smokeless tobacco: Never Used  Substance and Sexual Activity  . Alcohol use: Yes    Comment: social  . Drug use: No  . Sexual activity: Yes    Partners: Male  Other Topics Concern  . 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 Difficulty of Paying Living Expenses:   Food Insecurity:   . Worried About Ship broker in the Last Year:   . Arboriculturist in the Last Year:   Transportation Needs:   . Film/video editor (Medical):   Marland Kitchen Lack of Transportation (Non-Medical):   Physical Activity: Insufficiently Active  . Days of Exercise per Week: 3 days  . Minutes of Exercise per Session: 40 min  Stress:   . Feeling of Stress :   Social Connections:   . Frequency of Communication with Friends and Family:   . Frequency of Social Gatherings with Friends and Family:   . Attends Religious Services:   . Active Member of Clubs or Organizations:   . Attends Archivist Meetings:   Marland Kitchen Marital Status:   Intimate Partner Violence:   . Fear of Current or Ex-Partner:   . Emotionally Abused:   Marland Kitchen Physically Abused:   . Sexually Abused:     Past Surgical History:  Procedure Laterality Date  . APPENDECTOMY    . BACK SURGERY    . hystectomy  2020  . MASS EXCISION Left 10/14/2017   Procedure: EXCISION CYST LEFT WRIST VOLAR;  Surgeon: Daryll Brod, MD;  Location: [facility];  Service: Orthopedics;  Laterality: Left;  REG/UPPERARM    Family History  Problem Relation Age of Onset  . Atrial fibrillation Mother   . Diabetes Mellitus II Mother   . Hypertension Mother   . Obesity Father        ?  massive stroke   . Hypertension Father   . Hypertension Brother   . Diabetes Mellitus II Maternal Grandmother   . Diabetes Mellitus II Maternal Grandfather   . CAD Maternal Grandfather   . Colon cancer Paternal Grandmother   . Brain cancer Paternal Grandmother   . Throat cancer Paternal Grandfather   . Asthma Daughter     Allergies  Allergen Reactions  . Macrobid [Nitrofurantoin] Other (See Comments)    Myalgias, flu-like symptoms  . Sulfa Antibiotics Rash    Current Outpatient Medications on File Prior to Visit  Medication Sig Dispense Refill  . ibuprofen (ADVIL,MOTRIN) 800 MG tablet Take 1 tablet (800 mg total) by mouth every 8 (eight) hours as needed for moderate pain. 21  tablet 0   No current facility-administered medications on file prior to visit.    BP 117/87 (BP Location: Left Arm, Patient Position: Sitting, Cuff Size: Small)   Pulse 76   Temp 97.6 F (36.4 C) (Temporal)   Resp 16   Ht 5\' 6"  (1.676 m)   Wt 158 lb (71.7 kg)   LMP 09/30/2017   SpO2 100%   BMI 25.50 kg/m       Objective:   Physical Exam  Physical Exam  Constitutional: She is oriented to person, place, and time. She appears well-developed and well-nourished. No distress.  HENT:  Head: Normocephalic and atraumatic.  Right Ear: Tympanic membrane and ear canal normal.  Left Ear: Tympanic membrane and ear canal normal.  Mouth/Throat: Not examined- pt wearing mask Eyes: Pupils are equal, round, and reactive to light. No scleral icterus.  Neck: Normal range of motion. No thyromegaly present.  Cardiovascular: Normal rate and regular rhythm.   No murmur heard. Pulmonary/Chest: Effort normal and breath sounds normal. No respiratory distress. He has no wheezes. She has no rales. She exhibits no tenderness.  Abdominal: Soft. Bowel sounds are normal. She exhibits no distension and no mass. There is no tenderness. There is no rebound and no guarding.  Musculoskeletal: She exhibits no edema.  Lymphadenopathy:    She has no cervical adenopathy.  Neurological: She is alert and oriented to person, place, and time. She has normal patellar reflexes. She exhibits normal muscle tone. Coordination normal.  Skin: Skin is warm and dry.  Psychiatric: She has a normal mood and affect. Her behavior is normal. Judgment and thought content normal.       Assessment & Plan:   Preventative care- encouraged pt to continue healthy diet, regular exercise. Will obtain routine lab work.  Mammo and colo up to date. Completed covid vaccine series.  Declines shingrix today.        Assessment & Plan:

## 2019-08-18 LAB — CBC WITH DIFFERENTIAL/PLATELET
Basophils Absolute: 0.1 10*3/uL (ref 0.0–0.1)
Basophils Relative: 1.1 % (ref 0.0–3.0)
Eosinophils Absolute: 0.1 10*3/uL (ref 0.0–0.7)
Eosinophils Relative: 1.2 % (ref 0.0–5.0)
HCT: 40.4 % (ref 36.0–46.0)
Hemoglobin: 13.6 g/dL (ref 12.0–15.0)
Lymphocytes Relative: 22.4 % (ref 12.0–46.0)
Lymphs Abs: 2.2 10*3/uL (ref 0.7–4.0)
MCHC: 33.7 g/dL (ref 30.0–36.0)
MCV: 95.4 fl (ref 78.0–100.0)
Monocytes Absolute: 0.5 10*3/uL (ref 0.1–1.0)
Monocytes Relative: 5.6 % (ref 3.0–12.0)
Neutro Abs: 6.8 10*3/uL (ref 1.4–7.7)
Neutrophils Relative %: 69.7 % (ref 43.0–77.0)
Platelets: 323 10*3/uL (ref 150.0–400.0)
RBC: 4.23 Mil/uL (ref 3.87–5.11)
RDW: 13.3 % (ref 11.5–15.5)
WBC: 9.7 10*3/uL (ref 4.0–10.5)

## 2019-08-18 LAB — LIPID PANEL
Cholesterol: 186 mg/dL (ref 0–200)
HDL: 55.6 mg/dL (ref >39.00)
LDL Cholesterol: 107 mg/dL — ABNORMAL HIGH (ref 0–99)
NonHDL: 130.78
Total CHOL/HDL Ratio: 3
Triglycerides: 121 mg/dL (ref 0.0–149.0)
VLDL: 24.2 mg/dL (ref 0.0–40.0)

## 2019-08-18 LAB — BASIC METABOLIC PANEL
BUN: 16 mg/dL (ref 6–23)
CO2: 24 mEq/L (ref 19–32)
Calcium: 9.5 mg/dL (ref 8.4–10.5)
Chloride: 103 mEq/L (ref 96–112)
Creatinine, Ser: 0.84 mg/dL (ref 0.40–1.20)
GFR: 71.59 mL/min (ref >60.00)
Glucose, Bld: 97 mg/dL (ref 70–99)
Potassium: 4.1 mEq/L (ref 3.5–5.1)
Sodium: 137 mEq/L (ref 135–145)

## 2019-08-18 LAB — HEPATIC FUNCTION PANEL
ALT: 12 U/L (ref 0–35)
AST: 13 U/L (ref 0–37)
Albumin: 4.7 g/dL (ref 3.5–5.2)
Alkaline Phosphatase: 82 U/L (ref 39–117)
Bilirubin, Direct: 0.1 mg/dL (ref 0.0–0.3)
Total Bilirubin: 0.5 mg/dL (ref 0.2–1.2)
Total Protein: 6.9 g/dL (ref 6.0–8.3)

## 2019-08-18 LAB — TSH: TSH: 2.05 u[IU]/mL (ref 0.35–4.50)

## 2019-11-10 ENCOUNTER — Telehealth (INDEPENDENT_AMBULATORY_CARE_PROVIDER_SITE_OTHER): Payer: 59 | Admitting: Family Medicine

## 2019-11-10 ENCOUNTER — Other Ambulatory Visit: Payer: Self-pay

## 2019-11-10 ENCOUNTER — Encounter: Payer: Self-pay | Admitting: Family Medicine

## 2019-11-10 DIAGNOSIS — J019 Acute sinusitis, unspecified: Secondary | ICD-10-CM

## 2019-11-10 MED ORDER — AZITHROMYCIN 250 MG PO TABS: ORAL_TABLET | ORAL | 0 refills | Status: DC

## 2019-11-10 NOTE — Progress Notes (Signed)
   Subjective:    Patient ID: Margaret Small, female    DOB: Jul 29, 1970, 49 y.o.   MRN: [id number]  HPI Here for 4 days of sinus congestion, ear pressure, and PND. No cough or SOB or fever. No loss of taste or smell. No NVD. She is fully vaccinated against Covid-19. Virtual Visit via Telephone Note  I connected with the patient on 11/10/19 at  3:00 PM EDT by telephone and verified that I am speaking with the correct person using two identifiers.   I discussed the limitations, risks, security and privacy concerns of performing an evaluation and management service by telephone and the availability of in person appointments. I also discussed with the patient that there may be a patient responsible charge related to this service. The patient expressed understanding and agreed to proceed.  Location patient: home Location provider: work or home office Participants present for the call: patient, provider Patient did not have a visit in the prior 7 days to address this/these issue(s).   History of Present Illness:    Observations/Objective: Patient sounds cheerful and well on the phone. I do not appreciate any SOB. Speech and thought processing are grossly intact. Patient reported vitals:  Assessment and Plan: Sinusitis, treat with a Zpack.  Alysia Penna, MD   Follow Up Instructions:     [phone] 11-20 [phone] 21-30 I did not refer this patient for an OV in the next 24 hours for this/these issue(s).  I discussed the assessment and treatment plan with the patient. The patient was provided an opportunity to ask questions and all were answered. The patient agreed with the plan and demonstrated an understanding of the instructions.   The patient was advised to call back or seek an in-person evaluation if the symptoms worsen or if the condition fails to improve as anticipated.  I provided 11 minutes of non-face-to-face time during this encounter.   Alysia Penna, MD    Review of Systems  Constitutional: Negative.   HENT: Positive for congestion, ear pain, postnasal drip, sinus pressure and sore throat.   Eyes: Negative.   Respiratory: Negative.   Cardiovascular: Negative.        Objective:   Physical Exam        Assessment & Plan:

## 2019-12-28 ENCOUNTER — Ambulatory Visit
Admission: RE | Admit: 2019-12-28 | Discharge: 2019-12-28 | Disposition: A | Discharge status: Home / Self Care | Payer: 59 | Source: Ambulatory Visit | Attending: Obstetrics and Gynecology | Admitting: Obstetrics and Gynecology

## 2019-12-28 ENCOUNTER — Other Ambulatory Visit: Payer: Self-pay | Admitting: Obstetrics and Gynecology

## 2019-12-28 ENCOUNTER — Other Ambulatory Visit: Payer: Self-pay

## 2019-12-28 DIAGNOSIS — N63 Unspecified lump in unspecified breast: Secondary | ICD-10-CM

## 2019-12-28 DIAGNOSIS — R928 Other abnormal and inconclusive findings on diagnostic imaging of breast: Secondary | ICD-10-CM

## 2019-12-28 DIAGNOSIS — N6002 Solitary cyst of left breast: Secondary | ICD-10-CM

## 2020-04-13 ENCOUNTER — Ambulatory Visit (INDEPENDENT_AMBULATORY_CARE_PROVIDER_SITE_OTHER): Payer: Managed Care, Other (non HMO) | Admitting: Internal Medicine

## 2020-04-13 ENCOUNTER — Other Ambulatory Visit: Payer: Self-pay

## 2020-04-13 ENCOUNTER — Encounter: Payer: Self-pay | Admitting: Internal Medicine

## 2020-04-13 VITALS — BP 147/104 | HR 68 | Temp 98.1°F | Resp 16 | Ht 66.0 in | Wt 165.2 lb

## 2020-04-13 DIAGNOSIS — H9392 Unspecified disorder of left ear: Secondary | ICD-10-CM

## 2020-04-13 MED ORDER — CIPROFLOXACIN-DEXAMETHASONE 0.3-0.1 % OT SUSP: 4.0000 [drp] | Freq: Two times a day (BID) | OTIC | 0 refills | Status: DC

## 2020-04-13 NOTE — Patient Instructions (Signed)
Use the eardrops twice daily for 5 days  If you are not gradually better let me know.  If you develop severe pain, bleeding: Call immediately  If you are not better will refer you to a ENT doctor.

## 2020-04-13 NOTE — Progress Notes (Signed)
Subjective:    Patient ID: Margaret Small, female    DOB: 1970-10-21, 50 y.o.   MRN: [id number]  DOS:  04/13/2020 Type of visit - description: Acute: Symptoms a started a week ago, the patient felt that the left ear was plugged and she has some difficulty hearing. Used Debrox OTC thinking that this was wax but sxs have  not improved.  No actual pain. No discharge. No recent URI.     BP Readings from Last 3 Encounters:  04/13/20 (!) 147/104  08/17/19 117/87  07/13/19 121/82     Review of Systems See above   Past Medical History:  Diagnosis Date  . Mass of joint of left wrist     Past Surgical History:  Procedure Laterality Date  . APPENDECTOMY    . BACK SURGERY    . hystectomy  2020  . MASS EXCISION Left 10/14/2017   Procedure: EXCISION CYST LEFT WRIST VOLAR;  Surgeon: Daryll Brod, MD;  Location: [facility];  Service: Orthopedics;  Laterality: Left;  REG/UPPERARM    Allergies as of 04/13/2020      Reactions   Macrobid [nitrofurantoin] Other (See Comments)   Myalgias, flu-like symptoms   Sulfa Antibiotics Rash      Medication List       Accurate as of April 13, 2020  9:53 AM. If you have any questions, ask your nurse or doctor.        azithromycin 250 MG tablet Commonly known as: Zithromax Z-Pak As directed   ibuprofen 800 MG tablet Commonly known as: ADVIL Take 1 tablet (800 mg total) by mouth every 8 (eight) hours as needed for moderate pain.          Objective:   Physical Exam BP (!) 147/104 (BP Location: Left Arm, Patient Position: Sitting, Cuff Size: Small)   Pulse 68   Temp 98.1 F (36.7 C) (Oral)   Resp 16   Ht 5\' 6"  (1.676 m)   Wt 165 lb 4 oz (75 kg)   LMP 09/30/2017   SpO2 100%   BMI 26.67 kg/m  General:   Well developed, NAD, BMI noted. HEENT:  Normocephalic . Face symmetric, atraumatic Right ear: TM and canal normal Left ear: yellowish/whitish debris noted, unable to see the TM, canal is otherwise  normal. I carefully inserted spoon, I was able to remove a very small amount of the material, no bleeding, minimal pain during the procedure, no pain after. Skin: Not pale. Not jaundice Neurologic:  alert & oriented X3.  Speech normal, gait appropriate for age and unassisted Psych--  Cognition and judgment appear intact.  Cooperative with normal attention span and concentration.  Behavior appropriate. No anxious or depressed appearing.      Assessment    50 year old female, healthy, history of appendectomy, hysterectomy, presents with  Left ear disorder: As described above, for 1 week. On exam I saw some debris, I carefully inserted spoon, I was able to remove a very small amount of the material, no bleeding, minimal pain during the procedure, no pain after. This could be possibly simply wax but I prefer not to flush the canal  since I couldn't  see the TM. Plan: Ciprodex for 5 days, if not better will recommend ENT.     See AVS this visit occurred during the SARS-CoV-2 public health emergency.  Safety protocols were in place, including screening questions prior to the visit, additional usage of staff PPE, and extensive cleaning of exam room while observing  appropriate contact time as indicated for disinfecting solutions.

## 2020-04-13 NOTE — Progress Notes (Signed)
Pre visit review using our clinic review tool, if applicable. No additional management support is needed unless otherwise documented below in the visit note. 

## 2020-05-02 ENCOUNTER — Encounter (INDEPENDENT_AMBULATORY_CARE_PROVIDER_SITE_OTHER): Payer: Self-pay | Admitting: Otolaryngology

## 2020-05-02 ENCOUNTER — Ambulatory Visit (INDEPENDENT_AMBULATORY_CARE_PROVIDER_SITE_OTHER): Payer: Managed Care, Other (non HMO) | Admitting: Otolaryngology

## 2020-05-02 ENCOUNTER — Other Ambulatory Visit: Payer: Self-pay

## 2020-05-02 VITALS — Temp 97.7°F

## 2020-05-02 DIAGNOSIS — H6122 Impacted cerumen, left ear: Secondary | ICD-10-CM | POA: Diagnosis not present

## 2020-05-02 DIAGNOSIS — H60312 Diffuse otitis externa, left ear: Secondary | ICD-10-CM

## 2020-05-02 NOTE — Progress Notes (Signed)
HPI: Margaret Small is a 50 y.o. female who presents for evaluation of left ear complaints that she has had for 3 to 4 weeks.  She has always had itching in the left ear with occasional drainage from the left ear.  She does not have problems on the right side.  Over the past several weeks she felt a little more plugged.  She was prescribed Cipro eardrops.  She is having no pain.  Past Medical History:  Diagnosis Date  . Mass of joint of left wrist    Past Surgical History:  Procedure Laterality Date  . APPENDECTOMY    . BACK SURGERY    . hystectomy  2020  . MASS EXCISION Left 10/14/2017   Procedure: EXCISION CYST LEFT WRIST VOLAR;  Surgeon: Daryll Brod, MD;  Location: [facility];  Service: Orthopedics;  Laterality: Left;  REG/UPPERARM   Social History   Socioeconomic History  . Marital status: Married    Spouse name: [name]  . Number of children: 2  . Years of education: Not on file  . Highest education level: Not on file  Occupational History  . Occupation: Doctor, general practice  Tobacco Use  . Smoking status: Never Smoker  . Smokeless tobacco: Never Used  Substance and Sexual Activity  . Alcohol use: Yes    Comment: social  . Drug use: No  . Sexual activity: Yes    Partners: Male  Other Topics Concern  . 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 Days of Exercise per Week: 3 days  . Minutes of Exercise per Session: 40 min  Stress: Not on file  Social Connections: Not on file   Family History  Problem Relation Age of Onset  . Atrial fibrillation Mother   . Diabetes Mellitus II Mother   . Hypertension Mother   . Obesity Father        ? massive  stroke   . Hypertension Father   . Hypertension Brother   . Diabetes Mellitus II Maternal Grandmother   . Diabetes Mellitus II Maternal Grandfather   . CAD Maternal Grandfather   . Colon cancer Paternal Grandmother   . Brain cancer Paternal Grandmother   . Throat cancer Paternal Grandfather   . Asthma Daughter    Allergies  Allergen Reactions  . Macrobid [Nitrofurantoin] Other (See Comments)    Myalgias, flu-like symptoms  . Sulfa Antibiotics Rash   Prior to Admission medications   Medication Sig Start Date End Date Taking? Authorizing Provider  azithromycin (ZITHROMAX Z-PAK) 250 MG tablet As directed Patient not taking: Reported on 04/13/2020 11/10/19   Laurey Morale, MD  ciprofloxacin-dexamethasone (CIPRODEX) OTIC suspension Place 4 drops into the left ear 2 (two) times daily. 04/13/20   Colon Branch, MD  ibuprofen (ADVIL,MOTRIN) 800 MG tablet Take 1 tablet (800 mg total) by mouth every 8 (eight) hours as needed for moderate pain. Patient not taking: Reported on 04/13/2020 10/20/13   Orlie Dakin, MD     Positive ROS: Otherwise negative  All other systems have been reviewed and were otherwise negative with the exception of those mentioned in the HPI and as  above.  Physical Exam: Constitutional: Alert, well-appearing, no acute distress Ears: External ears without lesions or tenderness.  Right ear canal and right TM are normal.  Left ear canal reveals slight inflammatory changes with some drainage deep within the ear canal adjacent to the TM.  This was cleaned with hydroperoxide and alcohol or ear rinses and suction.  After cleaning the ear canal I applied gentian violet, Floxin and CSF powder to the left ear.  On tuning fork testing she heard about the same in both ears with AC > BC bilaterally. Nasal: External nose without lesions.. Clear nasal passages Oral: Lips and gums without lesions. Tongue and palate mucosa without lesions. Posterior oropharynx clear. Neck: No palpable  adenopathy or masses Respiratory: Breathing comfortably  Skin: No facial/neck lesions or rash noted.  Cerumen impaction removal  Date/Time: 05/02/2020 5:26 PM Performed by: Rozetta Nunnery, MD Authorized by: Rozetta Nunnery, MD   Consent:    Consent obtained:  Verbal   Consent given by:  Patient   Risks discussed:  Pain and bleeding Procedure details:    Location:  L ear   Procedure type: irrigation and suction   Post-procedure details:    Inspection:  TM intact and canal normal   Hearing quality:  Improved   Patient tolerance of procedure:  Tolerated well, no immediate complications Comments:     Left ear canal was cleaned of debris with suction.  After cleaning the left ear I applied gentian violet, Floxin and CSF powder.    Assessment: Mild left chronic external otitis with debris adjacent to the left TM  Plan: This was cleaned in the office.  After cleaning the ear canal I applied gentian violet and CSF powder to the left ear. Recommend keeping the ear dry for the next week. Reviewed with her concerning use of alcohol vinegar ear rinses if she has itching or problems in the future. She will follow-up as needed.  Radene Journey, MD

## 2020-06-27 ENCOUNTER — Other Ambulatory Visit: Payer: 59

## 2020-07-06 ENCOUNTER — Other Ambulatory Visit: Payer: Self-pay

## 2020-08-01 DIAGNOSIS — Z01419 Encounter for gynecological examination (general) (routine) without abnormal findings: Secondary | ICD-10-CM | POA: Diagnosis not present

## 2020-08-01 DIAGNOSIS — Z6826 Body mass index (BMI) 26.0-26.9, adult: Secondary | ICD-10-CM | POA: Diagnosis not present

## 2020-08-01 LAB — HM PAP SMEAR

## 2020-08-17 ENCOUNTER — Other Ambulatory Visit: Payer: Self-pay

## 2020-09-27 ENCOUNTER — Ambulatory Visit
Admission: RE | Admit: 2020-09-27 | Discharge: 2020-09-27 | Disposition: A | Discharge status: Home / Self Care | Payer: BC Managed Care – PPO | Source: Ambulatory Visit | Attending: Obstetrics and Gynecology | Admitting: Obstetrics and Gynecology

## 2020-09-27 ENCOUNTER — Other Ambulatory Visit: Payer: Self-pay

## 2020-09-27 DIAGNOSIS — N63 Unspecified lump in unspecified breast: Secondary | ICD-10-CM

## 2020-09-27 DIAGNOSIS — R922 Inconclusive mammogram: Secondary | ICD-10-CM | POA: Diagnosis not present

## 2020-10-03 ENCOUNTER — Ambulatory Visit: Payer: BC Managed Care – PPO | Attending: Internal Medicine

## 2020-10-03 DIAGNOSIS — Z20822 Contact with and (suspected) exposure to covid-19: Secondary | ICD-10-CM

## 2020-10-04 LAB — SARS-COV-2, NAA 2 DAY TAT

## 2020-10-04 LAB — NOVEL CORONAVIRUS, NAA: SARS-CoV-2, NAA: NOT DETECTED

## 2020-10-05 ENCOUNTER — Other Ambulatory Visit: Payer: BC Managed Care – PPO

## 2021-01-29 DIAGNOSIS — D225 Melanocytic nevi of trunk: Secondary | ICD-10-CM | POA: Diagnosis not present

## 2021-01-29 DIAGNOSIS — L821 Other seborrheic keratosis: Secondary | ICD-10-CM | POA: Diagnosis not present

## 2021-01-29 DIAGNOSIS — L814 Other melanin hyperpigmentation: Secondary | ICD-10-CM | POA: Diagnosis not present

## 2021-01-29 DIAGNOSIS — D1801 Hemangioma of skin and subcutaneous tissue: Secondary | ICD-10-CM | POA: Diagnosis not present

## 2021-02-06 ENCOUNTER — Ambulatory Visit (INDEPENDENT_AMBULATORY_CARE_PROVIDER_SITE_OTHER): Payer: BC Managed Care – PPO | Admitting: Family

## 2021-02-06 ENCOUNTER — Other Ambulatory Visit: Payer: Self-pay

## 2021-02-06 VITALS — BP 123/86 | HR 75 | Temp 97.7°F | Resp 16 | Wt 173.0 lb

## 2021-02-06 DIAGNOSIS — B353 Tinea pedis: Secondary | ICD-10-CM | POA: Insufficient documentation

## 2021-02-06 MED ORDER — TERBINAFINE HCL 250 MG PO TABS: 250.0000 mg | ORAL_TABLET | Freq: Every day | ORAL | 0 refills | Status: DC

## 2021-02-06 NOTE — Patient Instructions (Signed)
Please begin lamisil tablets once daily for 1 week, then apply over the counter lamisil cream as needed.

## 2021-02-06 NOTE — Progress Notes (Signed)
Subjective:   By signing my name below, I, Margaret Small, attest that this documentation has been prepared under the direction and in the presence of Margaret Alar, NP, 02/06/2021   Patient ID: Margaret Small, female    DOB: Nov 10, 1970, 50 y.o.   MRN: [id number]  Chief Complaint  Patient presents with   Foot Problem    Complains of let foot itching, dry skin.     HPI Patient is in today for an office visit.  Itching foot: She mentions that her left foot gets very itchy. She notes this has been happening for years and it will come and go. She has treated it as though it is athletes foot and it never fully heals. She says it will come and heal up for 6 months and then return again. She has used creams, powders, sprays, tea tree oil, and other methods with no relief. Dry skin on top of right foot and cracking skin between 2nd 3rd toe at base  Health Maintenance Due  Topic Date Due   Pneumococcal Vaccine 69-71 Years old (1 - PCV) Never done   HIV Screening  Never done   Hepatitis C Screening  Never done   Zoster Vaccines- Shingrix (1 of 2) Never done   COVID-19 Vaccine (4 - Booster for Moderna series) 03/31/2020    Past Medical History:  Diagnosis Date   Mass of joint of left wrist     Past Surgical History:  Procedure Laterality Date   APPENDECTOMY     BACK SURGERY     hystectomy  2020   MASS EXCISION Left 10/14/2017   Procedure: EXCISION CYST LEFT WRIST VOLAR;  Surgeon: Daryll Brod, MD;  Location: [facility];  Service: Orthopedics;  Laterality: Left;  REG/UPPERARM    Family History  Problem Relation Age of Onset   Atrial fibrillation Mother    Diabetes Mellitus II Mother    Hypertension Mother    Obesity Father        ? massive stroke    Hypertension Father    Hypertension Brother    Diabetes Mellitus II Maternal Grandmother    Diabetes Mellitus II Maternal Grandfather    CAD Maternal Grandfather    Colon cancer Paternal  Grandmother    Brain cancer Paternal Grandmother    Throat cancer Paternal Grandfather    Asthma Daughter     Social History   Socioeconomic History   Marital status: Married    Spouse name: Margaret Small   Number of children: 2   Years of education: Not on file   Highest education level: Not on file  Occupational History   Occupation: Doctor, general practice  Tobacco Use   Smoking status: Never   Smokeless tobacco: Never  Substance and Sexual Activity   Alcohol use: Yes    Comment: social   Drug use: No   Sexual activity: Yes    Partners: Male  Other Topics Concern   Not on file  Social History Narrative   Married   37 yr old daughter   90 yr old son   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     azithromycin (ZITHROMAX Z-PAK) 250 MG tablet As directed (Patient not taking: Reported on 04/13/2020) 6 each 0   ciprofloxacin-dexamethasone (CIPRODEX) OTIC suspension Place 4 drops into the left ear 2 (two) times daily. 7.5 mL 0   ibuprofen (ADVIL,MOTRIN) 800 MG tablet Take 1 tablet (800 mg total) by mouth every 8 (eight) hours as needed for moderate pain. (Patient not taking: No sig reported) 21 tablet 0   No facility-administered medications prior to visit.    Allergies  Allergen Reactions   Macrobid [Nitrofurantoin] Other (See Comments)    Myalgias, flu-like symptoms   Sulfa Antibiotics Rash    Review of Systems  Skin:  Positive for itching (on right foot between toes).      Objective:    Physical Exam Constitutional:      General: She is  not in acute distress.    Appearance: Normal appearance. She is not ill-appearing.  HENT:     Head: Normocephalic and atraumatic.     Right Ear: External ear normal.     Left Ear: External ear normal.  Eyes:     Extraocular Movements: Extraocular movements intact.     Pupils: Pupils are equal, round, and reactive to light.  Cardiovascular:     Rate and Rhythm: Normal rate and regular rhythm.     Heart sounds: Normal heart sounds. No murmur heard.   No gallop.  Pulmonary:     Effort: Pulmonary effort is normal. No respiratory distress.     Breath sounds: Normal breath sounds. No wheezing or rales.  Feet:     Right foot:     Skin integrity: Dry skin (on top of right foot) present.     Comments: (+) cracking skin between base of 2nd and 3rd toe Lymphadenopathy:     Cervical: No cervical adenopathy.  Skin:    General: Skin is warm and dry.  Neurological:     Mental Status: She is alert and oriented to person, place, and time.  Psychiatric:        Behavior: Behavior normal.        Judgment: Judgment normal.    BP 123/86 (BP Location: Right Arm, Patient Position: Sitting, Cuff Size: Small)   Pulse 75   Temp 97.7 F (36.5 C) (Oral)   Resp 16   Wt 173 lb (78.5 kg)   LMP 09/30/2017   SpO2 99%   BMI 27.92 kg/m  Wt Readings from Last 3 Encounters:  02/06/21 173 lb (78.5 kg)  04/13/20 165 lb 4 oz (75 kg)  08/17/19 158 lb (71.7 kg)       Assessment & Plan:   Problem List Items Addressed This Visit       Unprioritized   Athlete's foot on right - Primary    Recurrent.  Will rx with lamisil 250mg  once daily for 1 week.       Relevant Medications   terbinafine (LAMISIL) 250 MG tablet   Meds ordered this encounter  Medications   terbinafine (LAMISIL) 250 MG tablet    Sig: Take 1 tablet (250 mg total) by mouth daily.    Dispense:  7 tablet    Refill:  0    Order Specific Question:   Supervising Provider    Answer:   Penni Homans A [[id number]]    I, Margaret Alar,  NP, personally preformed the services described in this documentation.  All  medical record entries made by the scribe were at my direction and in my presence.  I have reviewed the chart and discharge instructions (if applicable) and agree that the record reflects my personal performance and is accurate and complete. 02/06/2021  I,Margaret Small,acting as a Education administrator for Nance Pear, NP.,have documented all relevant documentation on the behalf of Nance Pear, NP,as directed by  Nance Pear, NP while in the presence of Nance Pear, NP.  Nance Pear, NP

## 2021-02-06 NOTE — Assessment & Plan Note (Signed)
Recurrent.  Will rx with lamisil 250mg  once daily for 1 week.

## 2021-02-23 DIAGNOSIS — N390 Urinary tract infection, site not specified: Secondary | ICD-10-CM | POA: Diagnosis not present

## 2021-03-27 DIAGNOSIS — N760 Acute vaginitis: Secondary | ICD-10-CM | POA: Diagnosis not present

## 2021-04-18 ENCOUNTER — Other Ambulatory Visit: Payer: Self-pay

## 2021-04-18 ENCOUNTER — Ambulatory Visit (INDEPENDENT_AMBULATORY_CARE_PROVIDER_SITE_OTHER): Payer: BC Managed Care – PPO | Admitting: Nurse Practitioner

## 2021-04-18 ENCOUNTER — Encounter: Payer: Self-pay | Admitting: Nurse Practitioner

## 2021-04-18 VITALS — BP 128/90 | HR 70 | Temp 96.9°F | Wt 172.0 lb

## 2021-04-18 DIAGNOSIS — R109 Unspecified abdominal pain: Secondary | ICD-10-CM

## 2021-04-18 DIAGNOSIS — R1032 Left lower quadrant pain: Secondary | ICD-10-CM

## 2021-04-18 LAB — COMPREHENSIVE METABOLIC PANEL
ALT: 15 U/L (ref 0–35)
AST: 16 U/L (ref 0–37)
Albumin: 4.7 g/dL (ref 3.5–5.2)
Alkaline Phosphatase: 61 U/L (ref 39–117)
BUN: 17 mg/dL (ref 6–23)
CO2: 28 mEq/L (ref 19–32)
Calcium: 9.7 mg/dL (ref 8.4–10.5)
Chloride: 101 mEq/L (ref 96–112)
Creatinine, Ser: 0.88 mg/dL (ref 0.40–1.20)
GFR: 75.63 mL/min (ref >60.00)
Glucose, Bld: 93 mg/dL (ref 70–99)
Potassium: 5.1 mEq/L (ref 3.5–5.1)
Sodium: 136 mEq/L (ref 135–145)
Total Bilirubin: 0.5 mg/dL (ref 0.2–1.2)
Total Protein: 7.4 g/dL (ref 6.0–8.3)

## 2021-04-18 LAB — CBC WITH DIFFERENTIAL/PLATELET
Basophils Absolute: 0.1 10*3/uL (ref 0.0–0.1)
Basophils Relative: 0.9 % (ref 0.0–3.0)
Eosinophils Absolute: 0.1 10*3/uL (ref 0.0–0.7)
Eosinophils Relative: 0.9 % (ref 0.0–5.0)
HCT: 42.4 % (ref 36.0–46.0)
Hemoglobin: 14 g/dL (ref 12.0–15.0)
Lymphocytes Relative: 14.2 % (ref 12.0–46.0)
Lymphs Abs: 1.2 10*3/uL (ref 0.7–4.0)
MCHC: 33 g/dL (ref 30.0–36.0)
MCV: 95.1 fl (ref 78.0–100.0)
Monocytes Absolute: 0.4 10*3/uL (ref 0.1–1.0)
Monocytes Relative: 5 % (ref 3.0–12.0)
Neutro Abs: 6.8 10*3/uL (ref 1.4–7.7)
Neutrophils Relative %: 79 % — ABNORMAL HIGH (ref 43.0–77.0)
Platelets: 325 10*3/uL (ref 150.0–400.0)
RBC: 4.46 Mil/uL (ref 3.87–5.11)
RDW: 12.6 % (ref 11.5–15.5)
WBC: 8.6 10*3/uL (ref 4.0–10.5)

## 2021-04-18 LAB — POCT URINALYSIS DIPSTICK
Bilirubin, UA: NEGATIVE
Blood, UA: NEGATIVE
Glucose, UA: NEGATIVE
Ketones, UA: 80
Nitrite, UA: NEGATIVE
Protein, UA: POSITIVE — AB
Spec Grav, UA: 1.02 (ref 1.010–1.025)
Urobilinogen, UA: 1 E.U./dL
pH, UA: 6.5 (ref 5.0–8.0)

## 2021-04-18 MED ORDER — KETOROLAC TROMETHAMINE 60 MG/2ML IM SOLN: 30.0000 mg | Freq: Once | INTRAMUSCULAR | Status: AC

## 2021-04-18 NOTE — Progress Notes (Signed)
Acute Office Visit  Subjective:    Patient ID: Margaret Small, female    DOB: [birth year], 51 y.o.   MRN: [id number]  Chief Complaint  Patient presents with   Flank Pain    Pt c/o left flank pain x1 day. Pain score 9    HPI Patient is in today for left back/flank pain and left lower abdominal pain that started yesterday. She said that it went away last night and then the pain started again today around 11am. Pain is a sharp, throbbing pain and is 9/10. It is a constant pain and she had to leave work early today. She endorses diarrhea last night. This pain is associated with nausea. Denies hematuria, blood in stool, fever, dysuria, urinary frequency, rashes, shortness of breath, and chest pain.   She denies any recent falls or injury. She has not tried anything to help her symptoms, and is not away of any aggravating or alleviating factors. The pain does not get better or worse with changing positions. Of note, she started the optavia diet last week.   Past Medical History:  Diagnosis Date   Mass of joint of left wrist     Past Surgical History:  Procedure Laterality Date   APPENDECTOMY     BACK SURGERY     hystectomy  2020   MASS EXCISION Left 10/14/2017   Procedure: EXCISION CYST LEFT WRIST VOLAR;  Surgeon: Daryll Brod, MD;  Location: [facility];  Service: Orthopedics;  Laterality: Left;  REG/UPPERARM    Family History  Problem Relation Age of Onset   Atrial fibrillation Mother    Diabetes Mellitus II Mother    Hypertension Mother    Obesity Father        ? massive stroke    Hypertension Father    Hypertension Brother    Diabetes Mellitus II Maternal Grandmother    Diabetes Mellitus II Maternal Grandfather    CAD Maternal Grandfather    Colon cancer Paternal Grandmother    Brain cancer Paternal Grandmother    Throat cancer Paternal Grandfather    Asthma Daughter     Social History   Socioeconomic History   Marital status: Married     Spouse name: [name]   Number of children: 2   Years of education: Not on file   Highest education level: Not on file  Occupational History   Occupation: Doctor, general practice  Tobacco Use   Smoking status: Never   Smokeless tobacco: Never  Substance and Sexual Activity   Alcohol use: Yes    Comment: social   Drug use: No   Sexual activity: Yes    Partners: Male  Other Topics Concern   Not on file  Social History Narrative   Married   81 yr old daughter   3 yr old son   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     terbinafine (LAMISIL) 250 MG tablet Take 1 tablet (250 mg total) by mouth daily. (Patient not taking: Reported on 04/18/2021) 7 tablet 0  No facility-administered medications prior to visit.    Allergies  Allergen Reactions   Macrobid [Nitrofurantoin] Other (See Comments)    Myalgias, flu-like symptoms   Sulfa Antibiotics Rash    Review of Systems  See pertinent positives and negatives per HPI.     Objective:    Physical Exam Vitals and nursing note reviewed.  Constitutional:      General: She is not in acute distress.    Appearance: Normal appearance.  HENT:     Head: Normocephalic.  Eyes:     Conjunctiva/sclera: Conjunctivae normal.  Cardiovascular:     Rate and Rhythm: Normal rate and regular rhythm.     Pulses: Normal pulses.     Heart sounds: Normal heart sounds.  Pulmonary:     Effort: Pulmonary effort is normal.     Breath sounds: Normal breath sounds.  Abdominal:     General: Bowel sounds are normal.  There is no distension.     Palpations: Abdomen is soft.     Tenderness: There is abdominal tenderness (LLQ). There is no right CVA tenderness, left CVA tenderness, guarding or rebound.  Musculoskeletal:        General: No tenderness.     Cervical back: Normal range of motion.  Skin:    General: Skin is warm.  Neurological:     General: No focal deficit present.     Mental Status: She is alert and oriented to person, place, and time.  Psychiatric:        Mood and Affect: Mood normal.        Behavior: Behavior normal.        Thought Content: Thought content normal.        Judgment: Judgment normal.    BP 128/90 (BP Location: Left Arm, Patient Position: Sitting, Cuff Size: Normal)    Pulse 70    Temp (!) 96.9 F (36.1 C) (Temporal)    Wt 172 lb (78 kg)    LMP 09/30/2017    SpO2 99%    BMI 27.76 kg/m  Wt Readings from Last 3 Encounters:  04/18/21 172 lb (78 kg)  02/06/21 173 lb (78.5 kg)  04/13/20 165 lb 4 oz (75 kg)    Health Maintenance Due  Topic Date Due   Pneumococcal Vaccine 35-84 Years old (1 - PCV) Never done   HIV Screening  Never done   Hepatitis C Screening  Never done   Zoster Vaccines- Shingrix (1 of 2) Never done   COVID-19 Vaccine (4 - Booster for Moderna series) 03/31/2020    There are no preventive care reminders to display for this patient.   Lab Results  Component Value Date   TSH 2.05 08/17/2019   Lab Results  Component Value Date   WBC 9.7 08/17/2019   HGB 13.6 08/17/2019   HCT 40.4 08/17/2019   MCV 95.4 08/17/2019   PLT 323.0 08/17/2019   Lab Results  Component Value Date   NA 137 08/17/2019   K 4.1 08/17/2019   CO2 24 08/17/2019   GLUCOSE 97 08/17/2019   BUN 16 08/17/2019   CREATININE 0.84 08/17/2019   BILITOT 0.5 08/17/2019   ALKPHOS 82 08/17/2019   AST 13 08/17/2019   ALT 12 08/17/2019   PROT 6.9 08/17/2019   ALBUMIN 4.7 08/17/2019   CALCIUM 9.5 08/17/2019   ANIONGAP 14 10/20/2013   GFR 71.59 08/17/2019   Lab Results   Component Value Date   CHOL 186 08/17/2019   Lab Results  Component Value  Date   HDL 55.60 08/17/2019   Lab Results  Component Value Date   LDLCALC 107 (H) 08/17/2019   Lab Results  Component Value Date   TRIG 121.0 08/17/2019   Lab Results  Component Value Date   CHOLHDL 3 08/17/2019   No results found for: HGBA1C     Assessment & Plan:   Problem List Items Addressed This Visit       Other   Flank pain - Primary    Acute x1 day. Tenderness to palpation of LLQ. U/A shows 1+ leukocytes and 3+ ketones. Ketones may be related to new diet she started a week ago. Will check CMP, CBC, and stat CT abdomen. Concern for kidney stones vs diverticulitis. Will give toradol 4m in the office. Encouraged fluids, can alternate tylenol/ibuprofen as needed for pain. Will determine further plan and follow up based on lab and CT results.       Relevant Medications   ketorolac (TORADOL) injection 30 mg   Other Relevant Orders   POCT Urinalysis Dipstick (Completed)   Comp Met (CMET)   CBC w/Diff   CT Abdomen Pelvis W Contrast   Left lower quadrant abdominal pain    Acute x1 day. Tenderness to palpation of LLQ. U/A shows 1+ leukocytes and 3+ ketones. Ketones may be related to new diet she started a week ago. Will check CMP, CBC, and stat CT abdomen. Concern for kidney stones vs diverticulitis. Will give toradol 331min the office. Encouraged fluids, can alternate tylenol/ibuprofen as needed for pain. Will determine further plan and follow up based on lab and CT results.       Relevant Orders   Comp Met (CMET)   CBC w/Diff   CT Abdomen Pelvis W Contrast     Meds ordered this encounter  Medications   ketorolac (TORADOL) injection 30 mg     LaCharyl DancerNP

## 2021-04-18 NOTE — Patient Instructions (Signed)
It was great to see you!  We gave you a toradol shot in the office to help with your pain. When you get home you can alternate tylenol and ibuprofen.    Continue drinking plenty of fluids.  We are ordering a CT of your abdomen and will call you with the day and time and will call you with the results and the follow up plan. You can always reach out and ask questions in the meantime.  Take care,  Vance Peper, NP

## 2021-04-18 NOTE — Assessment & Plan Note (Signed)
Acute x1 day. Tenderness to palpation of LLQ. U/A shows 1+ leukocytes and 3+ ketones. Ketones may be related to new diet she started a week ago. Will check CMP, CBC, and stat CT abdomen. Concern for kidney stones vs diverticulitis. Will give toradol 30mg  in the office. Encouraged fluids, can alternate tylenol/ibuprofen as needed for pain. Will determine further plan and follow up based on lab and CT results.

## 2021-04-19 ENCOUNTER — Ambulatory Visit (HOSPITAL_COMMUNITY)
Admission: RE | Admit: 2021-04-19 | Discharge: 2021-04-19 | Disposition: A | Discharge status: Home / Self Care | Payer: BC Managed Care – PPO | Source: Ambulatory Visit | Attending: Nurse Practitioner | Admitting: Nurse Practitioner

## 2021-04-19 ENCOUNTER — Ambulatory Visit: Payer: BC Managed Care – PPO | Admitting: Family Medicine

## 2021-04-19 DIAGNOSIS — R1032 Left lower quadrant pain: Secondary | ICD-10-CM | POA: Diagnosis not present

## 2021-04-19 DIAGNOSIS — R109 Unspecified abdominal pain: Secondary | ICD-10-CM | POA: Insufficient documentation

## 2021-04-19 DIAGNOSIS — I700 Atherosclerosis of aorta: Secondary | ICD-10-CM | POA: Diagnosis not present

## 2021-04-19 MED ORDER — IOHEXOL 300 MG/ML  SOLN
75.0000 mL | Freq: Once | INTRAMUSCULAR | Status: AC | PRN
  Administered 2021-04-19: 75 mL via INTRAVENOUS

## 2021-04-23 ENCOUNTER — Ambulatory Visit: Payer: BC Managed Care – PPO | Admitting: Nurse Practitioner

## 2021-09-12 DIAGNOSIS — N959 Unspecified menopausal and perimenopausal disorder: Secondary | ICD-10-CM | POA: Diagnosis not present

## 2021-09-12 DIAGNOSIS — Z6823 Body mass index (BMI) 23.0-23.9, adult: Secondary | ICD-10-CM | POA: Diagnosis not present

## 2021-09-12 DIAGNOSIS — Z01419 Encounter for gynecological examination (general) (routine) without abnormal findings: Secondary | ICD-10-CM | POA: Diagnosis not present

## 2021-09-27 ENCOUNTER — Other Ambulatory Visit: Payer: Self-pay | Admitting: Obstetrics and Gynecology

## 2021-09-27 DIAGNOSIS — Z1231 Encounter for screening mammogram for malignant neoplasm of breast: Secondary | ICD-10-CM

## 2021-10-09 ENCOUNTER — Ambulatory Visit
Admission: RE | Admit: 2021-10-09 | Discharge: 2021-10-09 | Disposition: A | Discharge status: Home / Self Care | Payer: BC Managed Care – PPO | Source: Ambulatory Visit | Attending: Obstetrics and Gynecology | Admitting: Obstetrics and Gynecology

## 2021-10-09 DIAGNOSIS — Z1231 Encounter for screening mammogram for malignant neoplasm of breast: Secondary | ICD-10-CM

## 2021-10-10 ENCOUNTER — Other Ambulatory Visit: Payer: Self-pay | Admitting: Obstetrics and Gynecology

## 2021-10-10 DIAGNOSIS — N6320 Unspecified lump in the left breast, unspecified quadrant: Secondary | ICD-10-CM

## 2021-10-18 ENCOUNTER — Ambulatory Visit
Admission: RE | Admit: 2021-10-18 | Discharge: 2021-10-18 | Disposition: A | Discharge status: Home / Self Care | Payer: BC Managed Care – PPO | Source: Ambulatory Visit | Attending: Obstetrics and Gynecology | Admitting: Obstetrics and Gynecology

## 2021-10-18 DIAGNOSIS — N6320 Unspecified lump in the left breast, unspecified quadrant: Secondary | ICD-10-CM

## 2021-10-18 DIAGNOSIS — R922 Inconclusive mammogram: Secondary | ICD-10-CM | POA: Diagnosis not present

## 2021-10-18 DIAGNOSIS — N6322 Unspecified lump in the left breast, upper inner quadrant: Secondary | ICD-10-CM | POA: Diagnosis not present

## 2021-12-25 ENCOUNTER — Encounter: Payer: Self-pay | Admitting: Family

## 2021-12-25 ENCOUNTER — Ambulatory Visit (INDEPENDENT_AMBULATORY_CARE_PROVIDER_SITE_OTHER): Payer: BC Managed Care – PPO | Admitting: Family

## 2021-12-25 VITALS — BP 102/73 | HR 62 | Temp 97.6°F | Resp 16 | Ht 66.0 in | Wt 146.0 lb

## 2021-12-25 DIAGNOSIS — Z0000 Encounter for general adult medical examination without abnormal findings: Secondary | ICD-10-CM | POA: Insufficient documentation

## 2021-12-25 DIAGNOSIS — Z0001 Encounter for general adult medical examination with abnormal findings: Secondary | ICD-10-CM | POA: Diagnosis not present

## 2021-12-25 DIAGNOSIS — L309 Dermatitis, unspecified: Secondary | ICD-10-CM | POA: Diagnosis not present

## 2021-12-25 DIAGNOSIS — L304 Erythema intertrigo: Secondary | ICD-10-CM | POA: Diagnosis not present

## 2021-12-25 MED ORDER — NYSTATIN 100000 UNIT/GM EX CREA: 1.0000 | TOPICAL_CREAM | Freq: Two times a day (BID) | CUTANEOUS | 0 refills | Status: DC

## 2021-12-25 MED ORDER — BETAMETHASONE DIPROPIONATE 0.05 % EX CREA: TOPICAL_CREAM | Freq: Two times a day (BID) | CUTANEOUS | 0 refills | Status: AC

## 2021-12-25 NOTE — Assessment & Plan Note (Addendum)
Wt Readings from Last 3 Encounters:  12/25/21 146 lb (66.2 kg)  04/18/21 172 lb (78 kg)  02/06/21 173 lb (78.5 kg)   Continue healthy diet and exercise. Colo up to date. Mammo up to date. She will complete her shingrix series at the pharmacy.

## 2021-12-25 NOTE — Assessment & Plan Note (Signed)
New. Rx nystatin cream.

## 2021-12-25 NOTE — Progress Notes (Signed)
Subjective:   By signing my name below, I, Carylon Perches, attest that this documentation has been prepared under the direction and in the presence of [place], NP 12/25/2021     Patient ID: Margaret Small, female    DOB: 09/23/1970, 51 y.o.   MRN: [id number]  Chief Complaint  Patient presents with   Annual Exam    HPI Patient is in today for a comprehensive physical exam  Estrogen: She is currently taking 2 Mg of Estradiol for her ovarian symptoms. Headaches: She reports that she is having headaches but is not sure where to pinpoint the cause of her symptoms. She states that she has a stressful work environment.  Skin Rash: She reports of a skin rash on her lower abdomen, armpits and chest areas.   She denies having any fever, hearing and vision symptoms, new muscle pain, joint pain , new moles, rashes, congestion, sinus pain, sore throat, palpations, cough, SOB ,wheezing,n/v/d constipation, blood in stool, dysuria, frequency, hematuria, depression, anxiety, headaches at this time  Social history: She reports no recent surgeries. She denies of any changes to her family medical history.  Colonoscopy: Last completed on 12/25/2021. She was informed that she should undergo a colonoscopy every 5 years.   Mammogram: Last completed on 10/18/2021 Immunizations: She is UTD on tetanus vaccine. She received the first dose of the Shingrix vaccine in 10/2021.She is not interested in an HIV/HepC screening at this moment. Diet: She is maintaining a healthy diet. She is currently using the [facility] program and reports that she is losing weight  Wt Readings from Last 3 Encounters:  12/25/21 146 lb (66.2 kg)  04/18/21 172 lb (78 kg)  02/06/21 173 lb (78.5 kg)   Exercise: She is exercising two days a week Dental:  Vision:    Health Maintenance Due  Topic Date Due   COVID-19 Vaccine (5 - Moderna risk series) 04/06/2021   Zoster Vaccines- Shingrix (2 of 2) 12/07/2021     Past Medical History:  Diagnosis Date   Mass of joint of left wrist     Past Surgical History:  Procedure Laterality Date   APPENDECTOMY     BACK SURGERY     hystectomy  2020   MASS EXCISION Left 10/14/2017   Procedure: EXCISION CYST LEFT WRIST VOLAR;  Surgeon: Daryll Brod, MD;  Location: [facility];  Service: Orthopedics;  Laterality: Left;  REG/UPPERARM    Family History  Problem Relation Age of Onset   Atrial fibrillation Mother    Diabetes Mellitus II Mother    Hypertension Mother    Obesity Father        ? massive stroke    Hypertension Father    Hypertension Brother    Diabetes Mellitus II Maternal Grandmother    Diabetes Mellitus II Maternal Grandfather    CAD Maternal Grandfather    Colon cancer Paternal Grandmother    Brain cancer Paternal Grandmother    Throat cancer Paternal Grandfather    Asthma Daughter     Social History   Socioeconomic History   Marital status: Married    Spouse name: [name]   Number of children: 2   Years of education: Not on file   Highest education level: Not on file  Occupational History   Occupation: Doctor, general practice  Tobacco Use   Smoking status: Never   Smokeless tobacco: Never  Substance and Sexual Activity   Alcohol use: Yes    Comment: social   Drug use:  No   Sexual activity: Yes    Partners: Male  Other Topics Concern   Not on file  Social History Narrative   Married   38 yr old daughter (in [place])   67 yr old son   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     terbinafine (LAMISIL) 250 MG tablet Take 1 tablet (250 mg total) by mouth daily. (Patient not taking: Reported on 04/18/2021) 7 tablet 0   No facility-administered medications prior to visit.    Allergies  Allergen Reactions   Macrobid [Nitrofurantoin] Other (See Comments)    Myalgias, flu-like symptoms   Sulfa Antibiotics Rash    Review of Systems  Constitutional:  Negative for fever.  HENT:  Negative for congestion, hearing loss, sinus pain, sore throat and tinnitus.   Eyes:  Negative for blurred vision and double vision.  Respiratory:  Negative for cough, shortness of breath and wheezing.   Cardiovascular:  Negative for palpitations.  Gastrointestinal:  Negative for blood in stool, constipation, diarrhea, nausea and vomiting.  Genitourinary:  Negative for dysuria, frequency and hematuria.  Musculoskeletal:  Negative for joint pain and myalgias.  Skin:  Positive for rash (Lower Abdomen and Armpits).       (-) New Moles  Neurological:  Positive for headaches.  Psychiatric/Behavioral:  Negative for depression. The patient is not nervous/anxious.        Objective:    Physical Exam Constitutional:      General: She is not in acute distress.    Appearance: Normal appearance. She is not ill-appearing.  HENT:     Head: Normocephalic and atraumatic.     Right Ear: Tympanic membrane, ear canal and external ear normal.     Left Ear: Tympanic membrane, ear canal and external ear normal.  Eyes:     Extraocular Movements: Extraocular movements intact.     Pupils: Pupils are equal, round, and reactive to light.  Cardiovascular:     Rate and Rhythm: Normal rate and regular rhythm.     Heart sounds: Normal heart sounds. No murmur heard.    No gallop.  Pulmonary:     Effort: Pulmonary effort is normal. No  respiratory distress.     Breath sounds: Normal breath sounds. No wheezing or rales.  Abdominal:     General: Bowel sounds are normal. There is no distension.     Palpations: Abdomen is soft.     Tenderness: There is no abdominal tenderness. There is no guarding.  Musculoskeletal:     Comments: 5/5 strength in both upper and lower extremities    Skin:    General: Skin is warm and dry.     Findings: Erythema present.     Comments: Dry patch left anterior chest  Mild erythema lower abdomen  Neurological:     Mental Status:  She is alert and oriented to person, place, and time.     Deep Tendon Reflexes:     Reflex Scores:      Patellar reflexes are 2+ on the right side and 2+ on the left side. Psychiatric:        Mood and Affect: Mood normal.        Behavior: Behavior normal.        Judgment: Judgment normal.     BP 102/73 (BP Location: Right Arm, Patient Position: Sitting, Cuff Size: Small)   Pulse 62   Temp 97.6 F (36.4 C) (Oral)   Resp 16   Ht '5\' 6"'$  (1.676 m)   Wt 146 lb (66.2 kg)   LMP 09/30/2017   SpO2 100%   BMI 23.57 kg/m  Wt Readings from Last 3 Encounters:  12/25/21 146 lb (66.2 kg)  04/18/21 172 lb (78 kg)  02/06/21 173 lb (78.5 kg)       Assessment & Plan:   Problem List Items Addressed This Visit       Unprioritized   Preventative health care - Primary    Wt Readings from Last 3 Encounters:  12/25/21 146 lb (66.2 kg)  04/18/21 172 lb (78 kg)  02/06/21 173 lb (78.5 kg)  Continue healthy diet and exercise. Colo up to date. Mammo up to date. She will complete her shingrix series at the pharmacy.        Intertrigo    New. Rx nystatin cream.       Relevant Medications   nystatin cream (MYCOSTATIN)   Eczema    New. Rx betamethasone cream.       Relevant Medications   betamethasone dipropionate 0.05 % cream   Meds ordered this encounter  Medications   betamethasone dipropionate 0.05 % cream    Sig: Apply topically 2 (two) times daily. Apply  to dry area on chest twice daily as needed.    Dispense:  30 g    Refill:  0    Order Specific Question:   Supervising Provider    Answer:   Penni Homans A [[id number]]   nystatin cream (MYCOSTATIN)    Sig: Apply 1 Application topically 2 (two) times daily. (Apply to lower abdominal rash)    Dispense:  30 g    Refill:  0    Order Specific Question:   Supervising Provider    Answer:   Penni Homans A [[id number]]    I, Nance Pear, NP, personally preformed the services described in this documentation.  All medical record entries made by the scribe were at my direction and in my presence.  I have reviewed the chart and discharge instructions (if applicable) and agree that the record reflects my personal performance and is accurate and complete. 12/25/2021   I,Amber Collins,acting as a scribe for Nance Pear, NP.,have documented all relevant documentation on the behalf of Nance Pear, NP,as directed by  Nance Pear, NP while in the presence of Nance Pear, NP.    Nance Pear, NP

## 2021-12-25 NOTE — Assessment & Plan Note (Signed)
New. Rx betamethasone cream.

## 2022-01-28 IMAGING — US US BREAST*L* LIMITED INC AXILLA
1 series · 5 of 5 positions shown · non-contrast
Comparison: Previous exam(s).

CLINICAL DATA: 48-year-old female presenting for first six-month
follow-up of a probably benign left breast mass.

EXAM:
DIGITAL DIAGNOSTIC LEFT MAMMOGRAM WITH CAD AND TOMO
ULTRASOUND LEFT BREAST

[Series 1: us breast*left* limited inc axilla · 0.06mm/px · 5 of 5 slices shown]
[im 1/5]
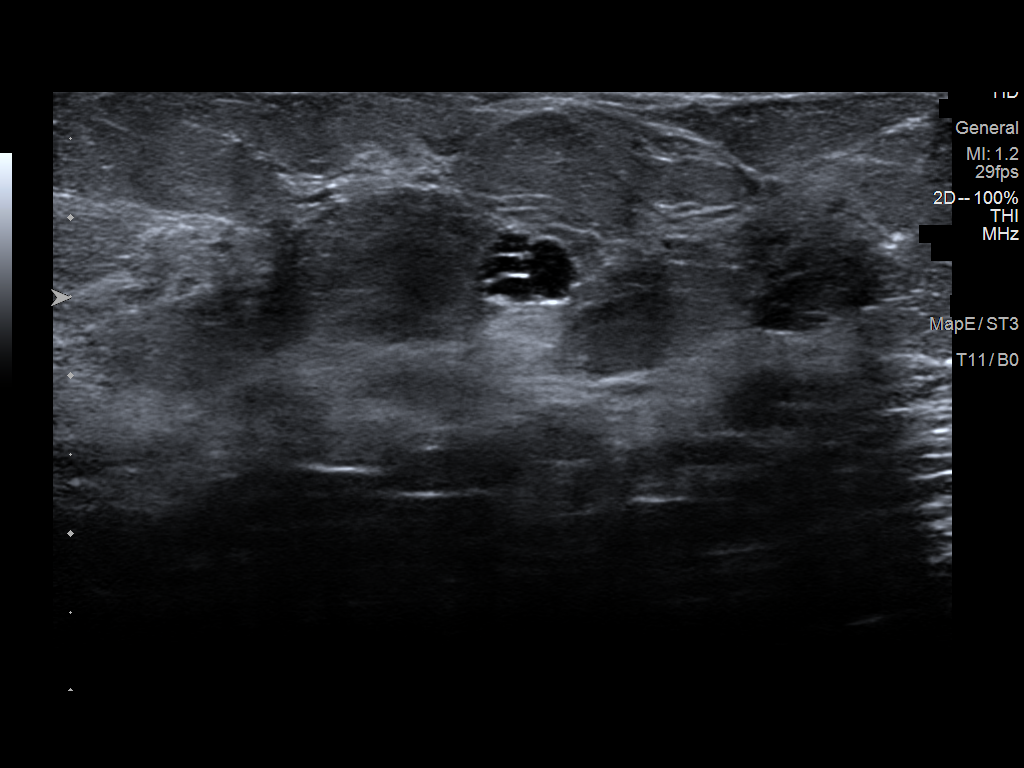
[im 2/5]
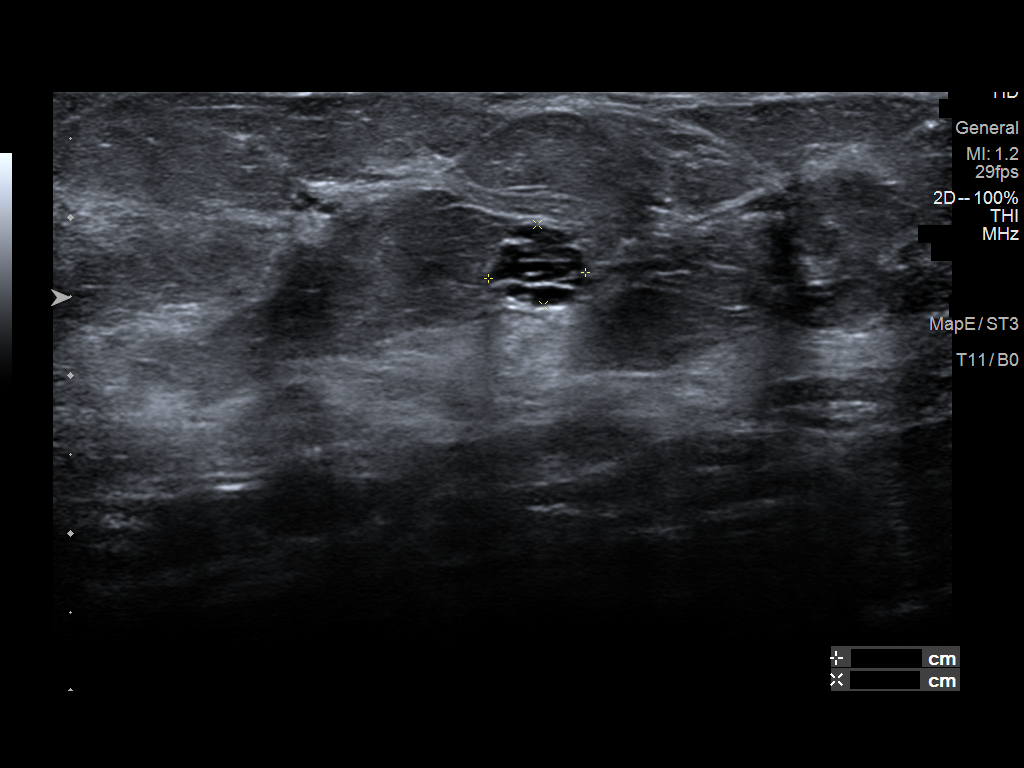
[im 3/5]
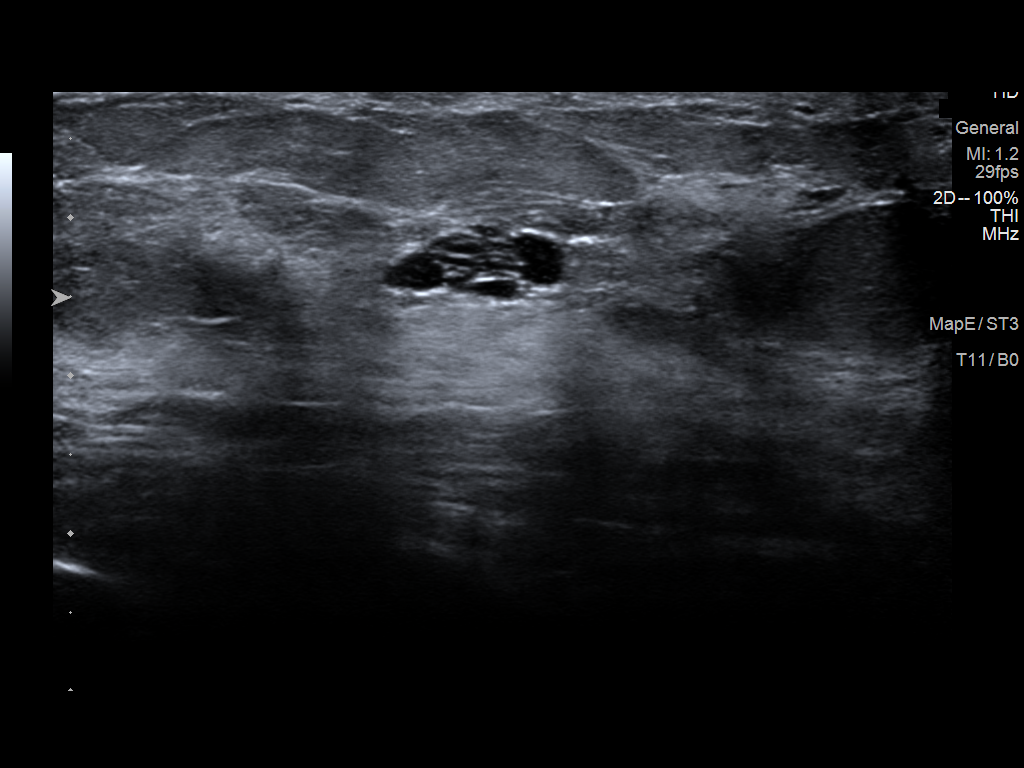
[im 4/5]
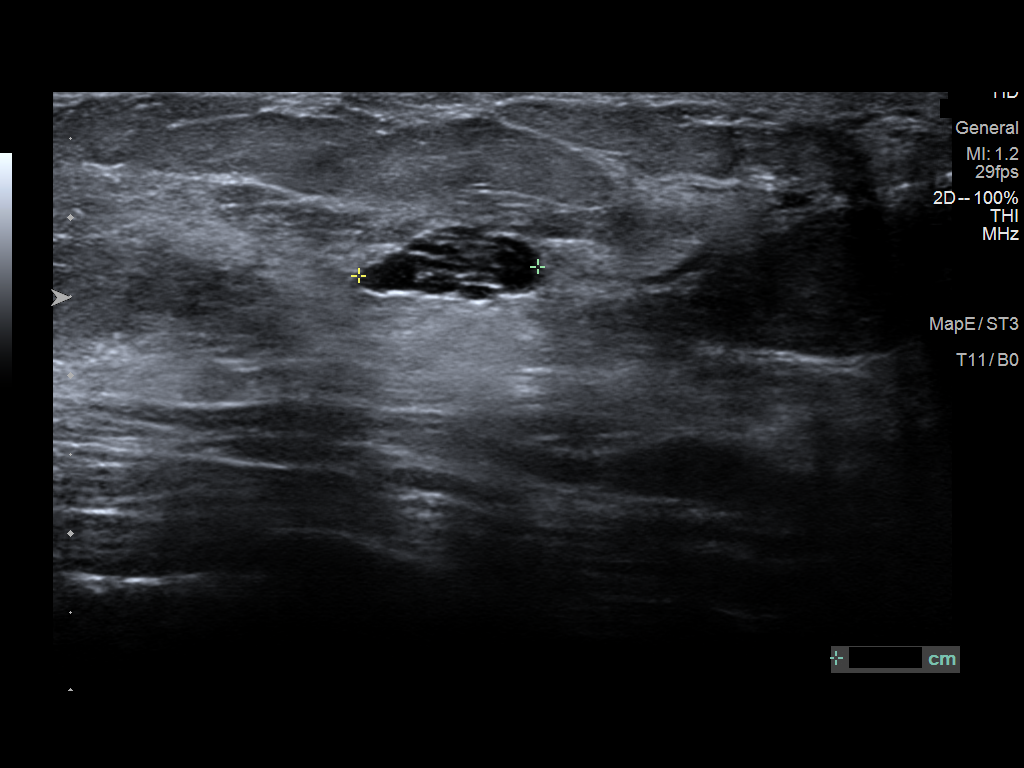
[im 5/5]
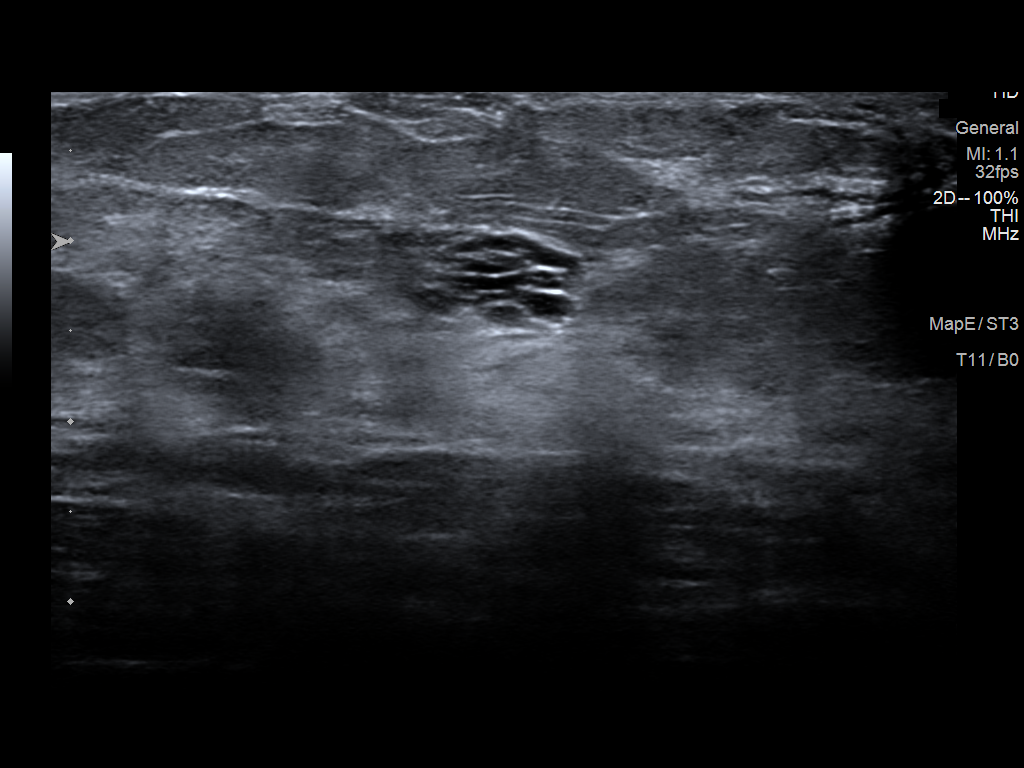

[5 of 5 positions shown; findings below may reference images not displayed]

ACR Breast Density Category c: The breast tissue is heterogeneously
dense, which may obscure small masses.
FINDINGS: Previously seen circumscribed mass in the medial left breast at
middle depth is less prominent on today's mammographic views. No
additional new or suspicious findings are identified in either
breast.

Mammographic images were processed with CAD.

Targeted ultrasound is performed, showing stable appearance of a
circumscribed, multi-cystic mass at the [DATE] position 2 cm from the
nipple. It measures 1.1 x 0.6 x 0.5 cm (previously 1.1 x 0.7 x
cm).
IMPRESSION: Stable, probably benign left breast mass. Recommendation is for
continued imaging follow-up to coincide with the patient's annual
bilateral mammograms.

RECOMMENDATION:
Bilateral diagnostic mammogram and left breast ultrasound in 6
months.

I have discussed the findings and recommendations with the patient.
If applicable, a reminder letter will be sent to the patient
regarding the next appointment.

BI-RADS CATEGORY  3: Probably benign.

## 2022-01-28 IMAGING — MG MM DIGITAL DIAGNOSTIC UNILAT*L* W/ TOMO W/ CAD
4 series · 4 of 12 positions shown · non-contrast
Comparison: Previous exam(s).

CLINICAL DATA: 48-year-old female presenting for first six-month
follow-up of a probably benign left breast mass.

EXAM:
DIGITAL DIAGNOSTIC LEFT MAMMOGRAM WITH CAD AND TOMO
ULTRASOUND LEFT BREAST

[L CC synth-2D]
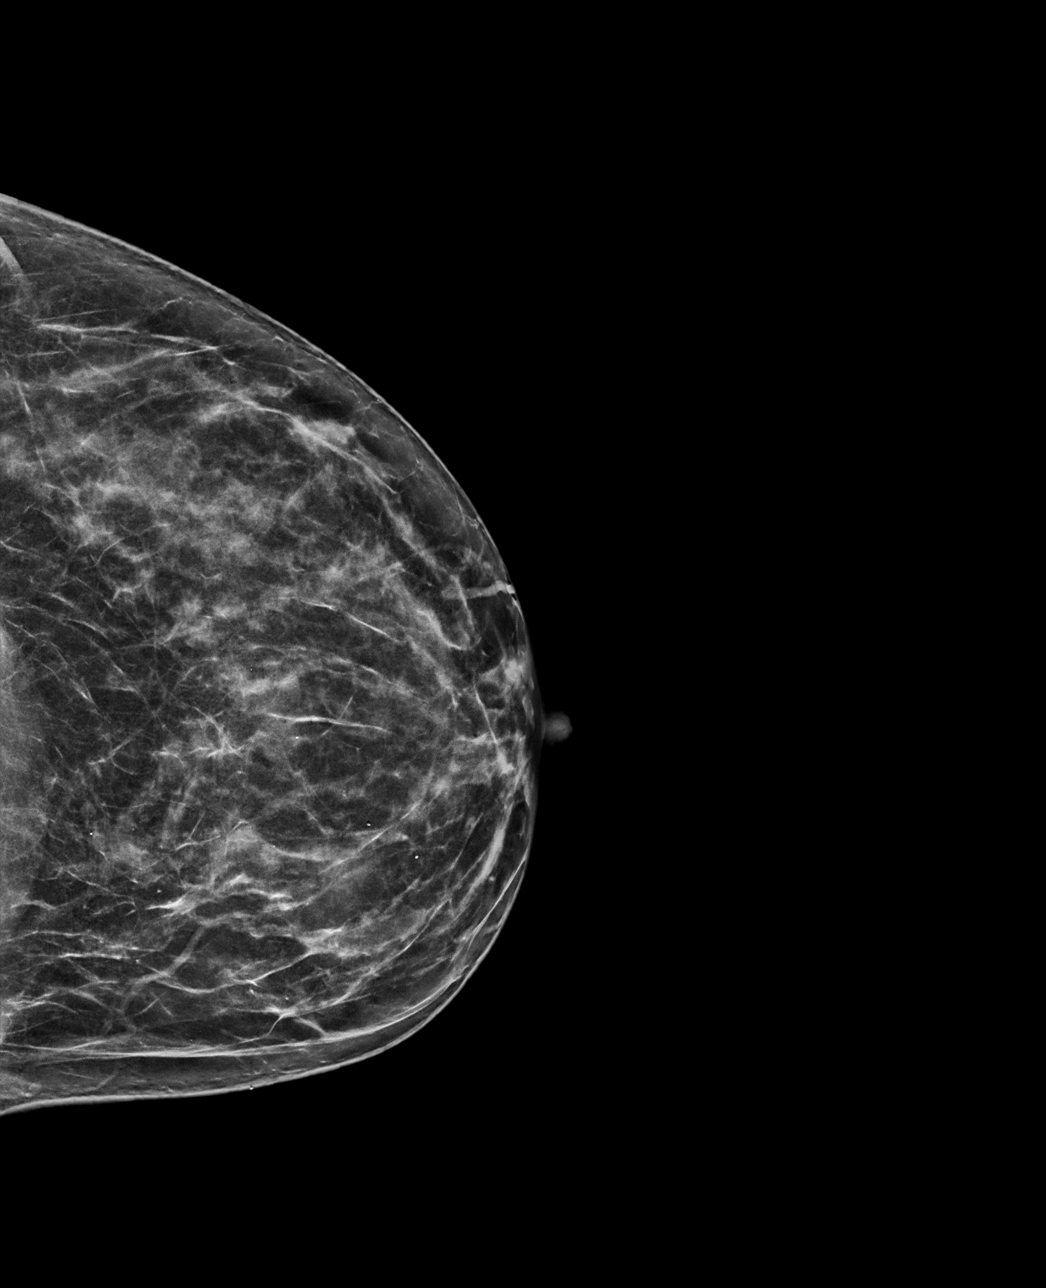

[L MLO synth-2D]
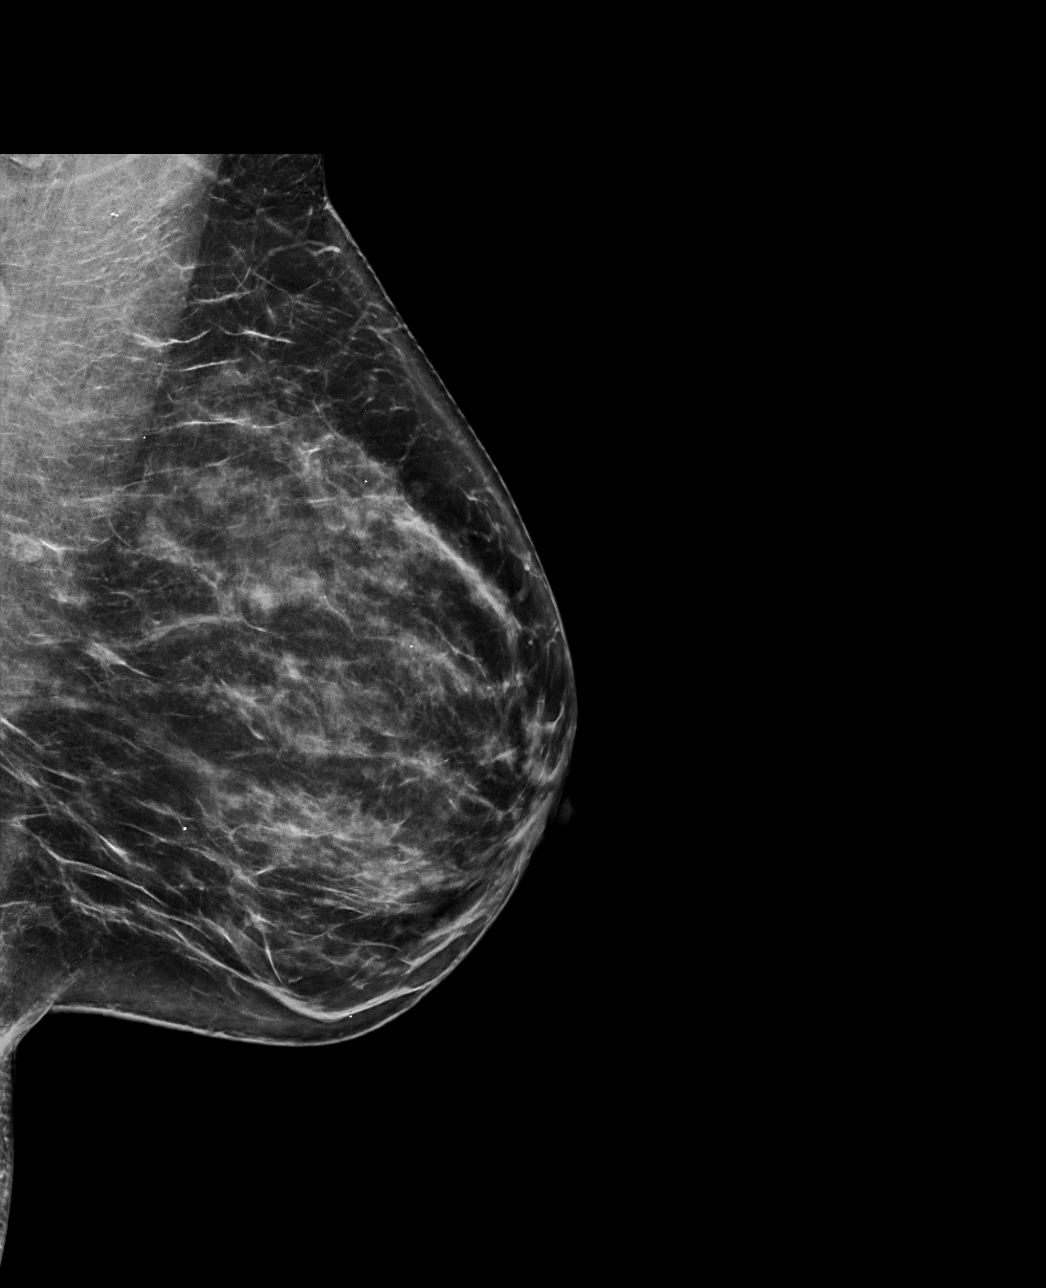

[L MLO tomo · tomo slice 38/75.0]
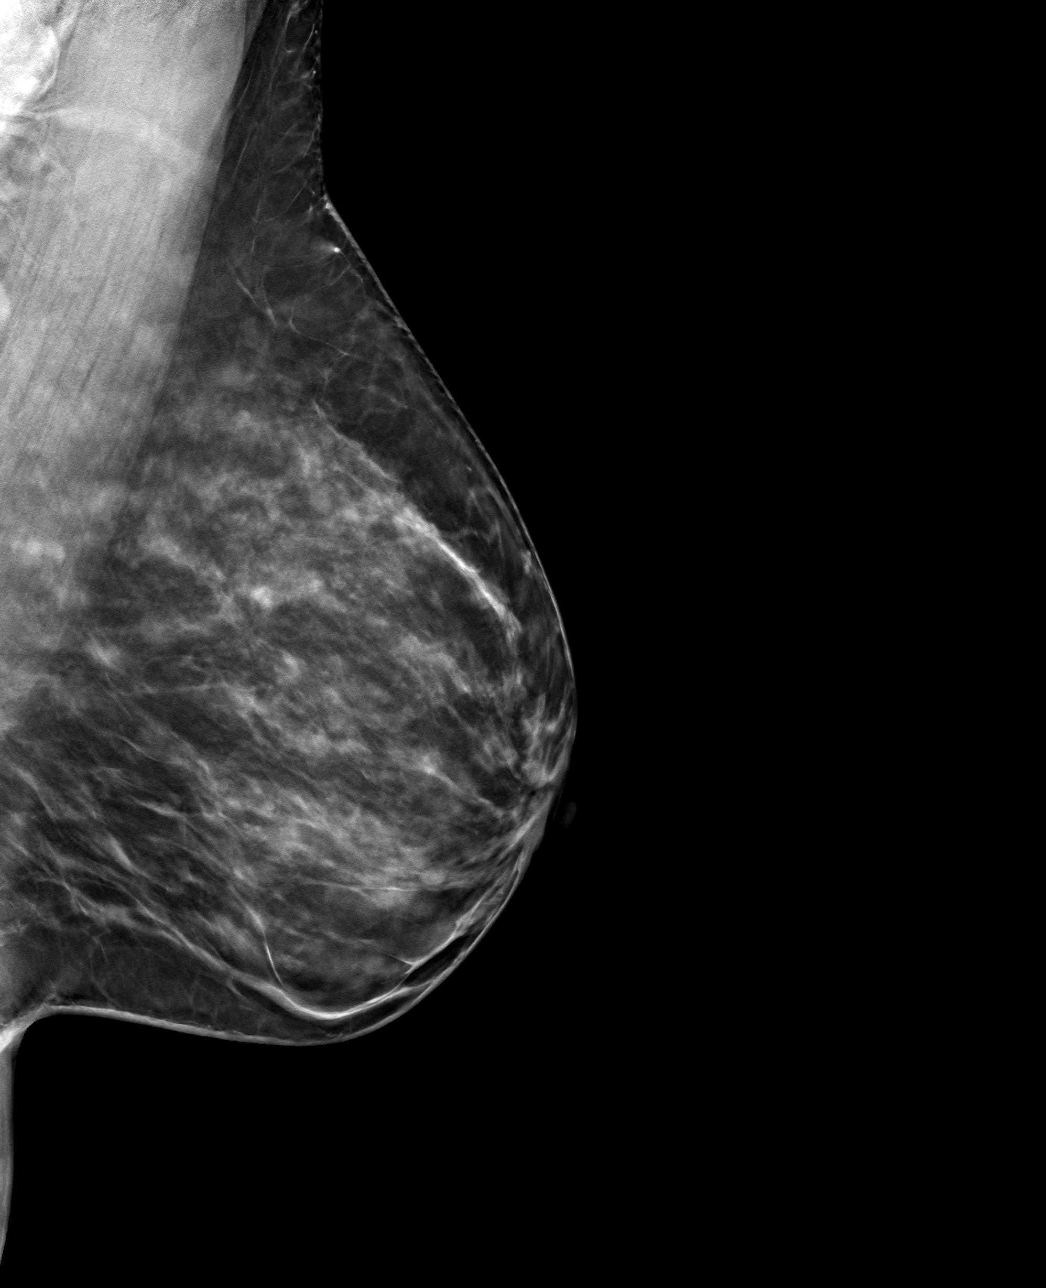

[L CC tomo · tomo slice 33/65.0]
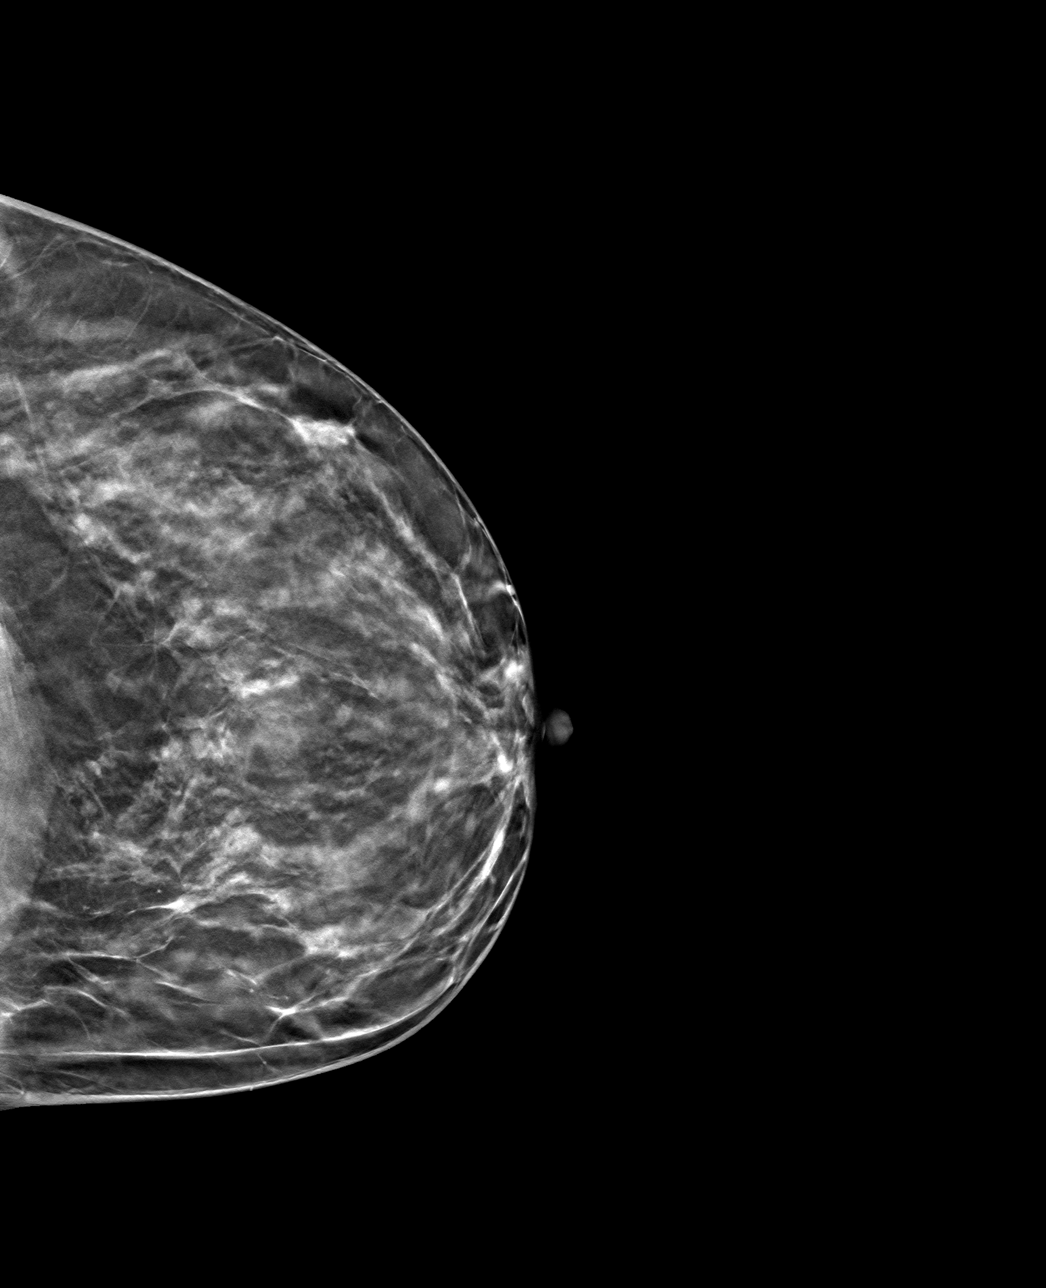

[4 of 12 positions shown; findings below may reference images not displayed]

ACR Breast Density Category c: The breast tissue is heterogeneously
dense, which may obscure small masses.
FINDINGS: Previously seen circumscribed mass in the medial left breast at
middle depth is less prominent on today's mammographic views. No
additional new or suspicious findings are identified in either
breast.

Mammographic images were processed with CAD.

Targeted ultrasound is performed, showing stable appearance of a
circumscribed, multi-cystic mass at the [DATE] position 2 cm from the
nipple. It measures 1.1 x 0.6 x 0.5 cm (previously 1.1 x 0.7 x
cm).
IMPRESSION: Stable, probably benign left breast mass. Recommendation is for
continued imaging follow-up to coincide with the patient's annual
bilateral mammograms.

RECOMMENDATION:
Bilateral diagnostic mammogram and left breast ultrasound in 6
months.

I have discussed the findings and recommendations with the patient.
If applicable, a reminder letter will be sent to the patient
regarding the next appointment.

BI-RADS CATEGORY  3: Probably benign.

## 2022-02-05 DIAGNOSIS — D225 Melanocytic nevi of trunk: Secondary | ICD-10-CM | POA: Diagnosis not present

## 2022-02-05 DIAGNOSIS — D485 Neoplasm of uncertain behavior of skin: Secondary | ICD-10-CM | POA: Diagnosis not present

## 2022-02-05 DIAGNOSIS — D2262 Melanocytic nevi of left upper limb, including shoulder: Secondary | ICD-10-CM | POA: Diagnosis not present

## 2022-02-05 DIAGNOSIS — D2272 Melanocytic nevi of left lower limb, including hip: Secondary | ICD-10-CM | POA: Diagnosis not present

## 2022-02-05 DIAGNOSIS — D2261 Melanocytic nevi of right upper limb, including shoulder: Secondary | ICD-10-CM | POA: Diagnosis not present

## 2022-03-01 DIAGNOSIS — M25511 Pain in right shoulder: Secondary | ICD-10-CM | POA: Diagnosis not present

## 2022-03-12 DIAGNOSIS — M6281 Muscle weakness (generalized): Secondary | ICD-10-CM | POA: Diagnosis not present

## 2022-03-12 DIAGNOSIS — S46091D Other injury of muscle(s) and tendon(s) of the rotator cuff of right shoulder, subsequent encounter: Secondary | ICD-10-CM | POA: Diagnosis not present

## 2022-03-28 DIAGNOSIS — M6281 Muscle weakness (generalized): Secondary | ICD-10-CM | POA: Diagnosis not present

## 2022-03-28 DIAGNOSIS — S46091D Other injury of muscle(s) and tendon(s) of the rotator cuff of right shoulder, subsequent encounter: Secondary | ICD-10-CM | POA: Diagnosis not present

## 2022-04-29 DIAGNOSIS — L814 Other melanin hyperpigmentation: Secondary | ICD-10-CM | POA: Diagnosis not present

## 2022-08-01 DIAGNOSIS — H11132 Conjunctival pigmentations, left eye: Secondary | ICD-10-CM | POA: Diagnosis not present

## 2022-09-24 ENCOUNTER — Ambulatory Visit: Payer: BC Managed Care – PPO | Admitting: Family

## 2022-09-24 ENCOUNTER — Other Ambulatory Visit (HOSPITAL_BASED_OUTPATIENT_CLINIC_OR_DEPARTMENT_OTHER): Payer: Self-pay

## 2022-09-24 VITALS — BP 108/79 | HR 68 | Temp 97.9°F | Wt 153.0 lb

## 2022-09-24 DIAGNOSIS — M5412 Radiculopathy, cervical region: Secondary | ICD-10-CM | POA: Insufficient documentation

## 2022-09-24 DIAGNOSIS — M5416 Radiculopathy, lumbar region: Secondary | ICD-10-CM | POA: Diagnosis not present

## 2022-09-24 MED ORDER — METHYLPREDNISOLONE 4 MG PO TBPK
ORAL_TABLET | ORAL | 0 refills | Status: DC
  Filled 2022-09-24: qty 21, 6d supply, fill #0

## 2022-09-24 NOTE — Assessment & Plan Note (Signed)
New. Will rx with medrol dose pack.  She is advised of red flags that should prompt her to go to the ED.  She will let me know if her symptoms worsen or if not improved in 1 week.

## 2022-09-24 NOTE — Patient Instructions (Addendum)
VISIT SUMMARY:  During your visit, we discussed your ongoing neck pain and numbness in your thumb and index finger, particularly in the mornings. We also discussed your recent severe pain in the hips, lower back, and front of both thighs, especially at night. We believe this new pain may be due to pressure on the nerves in your lower back.  YOUR PLAN:  -NECK PAIN AND NUMBNESS: This is likely due to nerve irritation in your neck, a condition known as Cervical Radiculopathy.   -LOWER BACK PAIN: Your new pain in the hips, lower back, and thighs is likely due to pressure on the nerves in your lower back, a condition known as Lumbar Nerve Root Compression. We will start you on a one-week course of oral steroids to help reduce inflammation and relieve your pain. This should help the pain/numbness in your right hand and in your legs/  If there's no improvement, we may consider further tests like an MRI and physical therapy.  INSTRUCTIONS:  Please avoid heavy back exercises and monitor for any sudden changes such as loss of bowel or bladder control or severe leg weakness. These could be signs of a more serious condition. Contact our office if there's no improvement after one week of taking the steroid therapy.

## 2022-09-24 NOTE — Progress Notes (Signed)
Subjective:     Patient ID: Margaret Small, female    DOB: 1970/09/03, 52 y.o.   MRN: [id number]  Chief Complaint  Patient presents with   Back Pain    Patient reports lower back pain with bilateral hip pain   Neck Pain    Patient complains of neck pain    Back Pain  Neck Pain     Discussed the use of AI scribe software for clinical note transcription with the patient, who gave verbal consent to proceed.  History of Present Illness   The patient, with a history of lower back surgery for a ruptured disc twenty five years ago, presents with occasional neck pain and numbness in the thumb and index finger, particularly upon waking. She initially sought treatment for shoulder pain, but physical therapy improved the shoulder strength and the therapist suggested the neck as the source of the pain. The patient has been living with the neck symptoms.  About six weeks ago, the patient began experiencing severe pain in the hips, lower back, and the front of both thighs, particularly at night when lying in bed. The pain is severe enough to necessitate movement for relief. She denies any specific injury or event that may have precipitated the pain. She has not tried any over the counter medications for the pain.          Health Maintenance Due  Topic Date Due   COVID-19 Vaccine (5 - 2023-24 season) 11/30/2021   Zoster Vaccines- Shingrix (2 of 2) 12/07/2021   PAP SMEAR-Modifier  06/03/2022    Past Medical History:  Diagnosis Date   Mass of joint of left wrist     Past Surgical History:  Procedure Laterality Date   APPENDECTOMY     BACK SURGERY     hystectomy  2020   MASS EXCISION Left 10/14/2017   Procedure: EXCISION CYST LEFT WRIST VOLAR;  Surgeon: Cindee Salt, MD;  Location: [facility];  Service: Orthopedics;  Laterality: Left;  REG/UPPERARM    Family History  Problem Relation Age of Onset   Atrial fibrillation Mother    Diabetes Mellitus II  Mother    Hypertension Mother    Obesity Father        ? massive stroke    Hypertension Father    Hypertension Brother    Diabetes Mellitus II Maternal Grandmother    Diabetes Mellitus II Maternal Grandfather    CAD Maternal Grandfather    Colon cancer Paternal Grandmother    Brain cancer Paternal Grandmother    Throat cancer Paternal Grandfather    Asthma Daughter     Social History   Socioeconomic History   Marital status: Married    Spouse name: Baldo Ash   Number of children: 2   Years of education: Not on file   Highest education level: Bachelor's degree (e.g., BA, AB, BS)  Occupational History   Occupation: Engineering geologist  Tobacco Use   Smoking status: Never   Smokeless tobacco: Never  Substance and Sexual Activity   Alcohol use: Yes    Comment: social   Drug use: No   Sexual activity: Yes    Partners: Male  Other Topics Concern   Not on file  Social History Narrative   Married   45 yr old daughter (in [place])   36 yr old son   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[place])    Social Determinants of Corporate investment banker Strain:  Patient Declined (09/23/2022)   Overall Financial Resource Strain (CARDIA)    Difficulty of Paying Living Expenses: Patient declined  Food Insecurity: No Food Insecurity (09/23/2022)   Hunger Vital Sign    Worried About Running Out of Food in the Last Year: Never true    Ran Out of Food in the Last Year: Never true  Transportation Needs: No Transportation Needs (09/23/2022)   PRAPARE - Administrator, Civil Service (Medical): No    Lack of Transportation (Non-Medical): No  Physical Activity: Insufficiently Active (09/23/2022)   Exercise Vital Sign    Days of Exercise per Week: 2 days    Minutes of Exercise per Session: 60 min  Stress: No Stress Concern Present (09/23/2022)   [employer] of Occupational Health - Occupational Stress Questionnaire    Feeling of Stress : Only a little   Social Connections: Socially Integrated (09/23/2022)   Social Connection and Isolation Panel [NHANES]    Frequency of Communication with Friends and Family: More than three times a week    Frequency of Social Gatherings with Friends and Family: Twice a week    Attends Religious Services: More than 4 times per year    Active Member of [employer] or Organizations: Yes    Attends Engineer, structural: More than 4 times per year    Marital Status: Married  Catering manager Violence: Not on file    Outpatient Medications Prior to Visit  Medication Sig Dispense Refill   betamethasone dipropionate 0.05 % cream Apply topically 2 (two) times daily. Apply to dry area on chest twice daily as needed. 30 g 0   estradiol (ESTRACE) 2 MG tablet      nystatin cream (MYCOSTATIN) Apply 1 Application topically 2 (two) times daily. (Apply to lower abdominal rash) 30 g 0   No facility-administered medications prior to visit.    Allergies  Allergen Reactions   Macrobid [Nitrofurantoin] Other (See Comments)    Myalgias, flu-like symptoms   Sulfa Antibiotics Rash    Review of Systems  Musculoskeletal:  Positive for back pain and neck pain.       Objective:    Physical Exam Constitutional:      General: She is not in acute distress.    Appearance: Normal appearance. She is well-developed.  HENT:     Head: Normocephalic and atraumatic.     Right Ear: External ear normal.     Left Ear: External ear normal.  Eyes:     General: No scleral icterus. Neck:     Thyroid: No thyromegaly.  Cardiovascular:     Rate and Rhythm: Normal rate and regular rhythm.     Heart sounds: Normal heart sounds. No murmur heard. Pulmonary:     Effort: Pulmonary effort is normal. No respiratory distress.     Breath sounds: Normal breath sounds. No wheezing.  Musculoskeletal:     Cervical back: Normal and neck supple. No swelling or tenderness.     Thoracic back: Normal. No swelling or tenderness.     Lumbar  back: Normal. No swelling or tenderness.     Comments: Bilateral LE strength is 5/5  Skin:    General: Skin is warm and dry.  Neurological:     Mental Status: She is alert and oriented to person, place, and time.     Deep Tendon Reflexes:     Reflex Scores:      Patellar reflexes are 2+ on the right side and 2+ on the left side.  Psychiatric:        Mood and Affect: Mood normal.        Behavior: Behavior normal.        Thought Content: Thought content normal.        Judgment: Judgment normal.      BP 108/79 (BP Location: Right Arm, Patient Position: Sitting, Cuff Size: Small)   Pulse 68   Temp 97.9 F (36.6 C) (Oral)   Wt 153 lb (69.4 kg)   LMP 09/30/2017   SpO2 100%   BMI 24.69 kg/m  Wt Readings from Last 3 Encounters:  09/24/22 153 lb (69.4 kg)  12/25/21 146 lb (66.2 kg)  04/18/21 172 lb (78 kg)       Assessment & Plan:   Problem List Items Addressed This Visit       Unprioritized   Lumbar radiculopathy - Primary    New. Will rx with medrol dose pack.  She is advised of red flags that should prompt her to go to the ED.  She will let me know if her symptoms worsen or if not improved in 1 week.       Cervical radiculopathy    New. Will rx with medrol dose pack.  She is advised of red flags that should prompt her to go to the ED.  She will let me know if her symptoms worsen or if not improved in 1 week.        I have discontinued Skipper Cliche. Glaus's nystatin cream. I am also having her start on methylPREDNISolone. Additionally, I am having her maintain her estradiol and betamethasone dipropionate.  Meds ordered this encounter  Medications   methylPREDNISolone (MEDROL DOSEPAK) 4 MG TBPK tablet    Sig: Take as directed per package instructions for 6 days.    Dispense:  21 tablet    Refill:  0    Order Specific Question:   Supervising Provider    Answer:   Danise Edge A [[id number]]

## 2022-09-24 NOTE — Assessment & Plan Note (Signed)
New. Will rx with medrol dose pack.  She is advised of red flags that should prompt her to go to the ED.  She will let me know if her symptoms worsen or if not improved in 1 week.  

## 2022-10-16 ENCOUNTER — Ambulatory Visit: Payer: BC Managed Care – PPO | Admitting: Family

## 2022-10-21 DIAGNOSIS — Z6825 Body mass index (BMI) 25.0-25.9, adult: Secondary | ICD-10-CM | POA: Diagnosis not present

## 2022-10-21 DIAGNOSIS — Z01419 Encounter for gynecological examination (general) (routine) without abnormal findings: Secondary | ICD-10-CM | POA: Diagnosis not present

## 2022-10-21 DIAGNOSIS — Z1231 Encounter for screening mammogram for malignant neoplasm of breast: Secondary | ICD-10-CM | POA: Diagnosis not present

## 2022-10-21 LAB — HM MAMMOGRAPHY

## 2022-11-05 ENCOUNTER — Ambulatory Visit: Payer: BC Managed Care – PPO | Admitting: Family

## 2022-11-05 ENCOUNTER — Other Ambulatory Visit (HOSPITAL_BASED_OUTPATIENT_CLINIC_OR_DEPARTMENT_OTHER): Payer: Self-pay

## 2022-11-05 VITALS — BP 122/73 | HR 66 | Temp 97.7°F | Resp 16 | Wt 156.0 lb

## 2022-11-05 DIAGNOSIS — J020 Streptococcal pharyngitis: Secondary | ICD-10-CM | POA: Diagnosis not present

## 2022-11-05 LAB — POCT INFLUENZA A/B
Influenza A, POC: NEGATIVE
Influenza B, POC: NEGATIVE

## 2022-11-05 LAB — POCT RAPID STREP A (OFFICE): Rapid Strep A Screen: POSITIVE — AB

## 2022-11-05 MED ORDER — AMOXICILLIN 500 MG PO CAPS
500.0000 mg | ORAL_CAPSULE | Freq: Two times a day (BID) | ORAL | 0 refills | Status: AC
  Filled 2022-11-05: qty 20, 10d supply, fill #0

## 2022-11-05 NOTE — Progress Notes (Unsigned)
Subjective:     Patient ID: Margaret Small, female    DOB: 1970/05/09, 52 y.o.   MRN: [id number]  Chief Complaint  Patient presents with   Sore Throat    Patient complains of sore throat since Sunday, negative covid test at home yesterday    Sore Throat  Associated symptoms include congestion, coughing and headaches. Pertinent negatives include no ear discharge or ear pain.    Discussed the use of AI scribe software for clinical note transcription with the patient, who gave verbal consent to proceed.  52 year old female presents to the clinic today for sore throat. She states that it started on Sunday afternoon. Patient admits that she has been traveling a lot this past week with her family and she has been around a lot of people. She admits that she has had a fever, sore throat, headaches, nonproductive cough, and runny nose. She is taking Tylenol for her headache that she states is helping. Covid test was negative yesterday.       Health Maintenance Due  Topic Date Due   COVID-19 Vaccine (5 - 2023-24 season) 11/30/2021   Zoster Vaccines- Shingrix (2 of 2) 12/07/2021   PAP SMEAR-Modifier  06/03/2022   INFLUENZA VACCINE  10/31/2022    Past Medical History:  Diagnosis Date   Mass of joint of left wrist     Past Surgical History:  Procedure Laterality Date   APPENDECTOMY     BACK SURGERY     hystectomy  2020   MASS EXCISION Left 10/14/2017   Procedure: EXCISION CYST LEFT WRIST VOLAR;  Surgeon: Cindee Salt, MD;  Location: [facility];  Service: Orthopedics;  Laterality: Left;  REG/UPPERARM    Family History  Problem Relation Age of Onset   Atrial fibrillation Mother    Diabetes Mellitus II Mother    Hypertension Mother    Obesity Father        ? massive stroke    Hypertension Father    Hypertension Brother    Diabetes Mellitus II Maternal Grandmother    Diabetes Mellitus II Maternal Grandfather    CAD Maternal Grandfather    Colon cancer  Paternal Grandmother    Brain cancer Paternal Grandmother    Throat cancer Paternal Grandfather    Asthma Daughter     Social History   Socioeconomic History   Marital status: Married    Spouse name: Baldo Ash   Number of children: 2   Years of education: Not on file   Highest education level: Bachelor's degree (e.g., BA, AB, BS)  Occupational History   Occupation: Engineering geologist  Tobacco Use   Smoking status: Never   Smokeless tobacco: Never  Substance and Sexual Activity   Alcohol use: Yes    Comment: social   Drug use: No   Sexual activity: Yes    Partners: Male  Other Topics Concern   Not on file  Social History Narrative   Married   60 yr old daughter (in [place])   65 yr old son   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[place])    Social Determinants of Health   Financial Resource Strain: Patient Declined (09/23/2022)   Overall Financial Resource Strain (CARDIA)    Difficulty of Paying Living Expenses: Patient declined  Food Insecurity: No Food Insecurity (09/23/2022)   Hunger Vital Sign    Worried About Running Out of Food in the Last Year: Never true    Ran Out of  Food in the Last Year: Never true  Transportation Needs: No Transportation Needs (09/23/2022)   PRAPARE - Administrator, Civil Service (Medical): No    Lack of Transportation (Non-Medical): No  Physical Activity: Insufficiently Active (09/23/2022)   Exercise Vital Sign    Days of Exercise per Week: 2 days    Minutes of Exercise per Session: 60 min  Stress: No Stress Concern Present (09/23/2022)   [employer] of Occupational Health - Occupational Stress Questionnaire    Feeling of Stress : Only a little  Social Connections: Socially Integrated (09/23/2022)   Social Connection and Isolation Panel [NHANES]    Frequency of Communication with Friends and Family: More than three times a week    Frequency of Social Gatherings with Friends and Family: Twice a week     Attends Religious Services: More than 4 times per year    Active Member of [employer] or Organizations: Yes    Attends Engineer, structural: More than 4 times per year    Marital Status: Married  Catering manager Violence: Low Risk  (11/29/2019)   Received from [facility]   Intimate Partner Violence    Insults You: Not on file    Threatens You: Not on file    Screams at [place]: Not on file    Physically Hurt: Not on file    Intimate Partner Violence Score: Not on file    Outpatient Medications Prior to Visit  Medication Sig Dispense Refill   betamethasone dipropionate 0.05 % cream Apply topically 2 (two) times daily. Apply to dry area on chest twice daily as needed. 30 g 0   estradiol (ESTRACE) 2 MG tablet      methylPREDNISolone (MEDROL DOSEPAK) 4 MG TBPK tablet Take as directed per package instructions for 6 days. 21 tablet 0   No facility-administered medications prior to visit.    Allergies  Allergen Reactions   Macrobid [Nitrofurantoin] Other (See Comments)    Myalgias, flu-like symptoms   Sulfa Antibiotics Rash    Review of Systems  Constitutional:  Positive for malaise/fatigue. Negative for chills.  HENT:  Positive for congestion and sore throat. Negative for ear discharge and ear pain.   Eyes:  Negative for pain, discharge and redness.  Respiratory:  Positive for cough.   Cardiovascular:  Negative for chest pain and palpitations.  Neurological:  Positive for headaches.       Objective:    Physical Exam Constitutional:      Appearance: She is normal weight.  HENT:     Head: Normocephalic.     Nose: Congestion and rhinorrhea present.     Mouth/Throat:     Pharynx: Pharyngeal swelling (redness and swelling noted in back of throat) and posterior oropharyngeal erythema present.     Comments: Swelling and redness noted near tonsils and uvula and the back of the throat.  Cardiovascular:     Rate and Rhythm: Normal rate and regular rhythm.   Musculoskeletal:     Cervical back: Normal range of motion.  Skin:    General: Skin is warm.  Neurological:     Mental Status: She is alert.      BP 122/73 (BP Location: Right Arm, Patient Position: Sitting, Cuff Size: Small)   Pulse 66   Temp 97.7 F (36.5 C) (Oral)   Resp 16   Wt 156 lb (70.8 kg)   LMP 09/30/2017   SpO2 100%   BMI 25.18 kg/m  Wt Readings from Last  3 Encounters:  11/05/22 156 lb (70.8 kg)  09/24/22 153 lb (69.4 kg)  12/25/21 146 lb (66.2 kg)       Assessment & Plan:   Problem List Items Addressed This Visit       Unprioritized   Strep pharyngitis - Primary    New. Rapid strep positive.  Will rx with amoxicillin. Recommended ibuprofen/cepacol lozenges prn pain. Discard and replace toothbrush in 24 hours.  Call if symptoms worsen or if symptoms fail to improve.       Relevant Orders   POCT rapid strep A (Completed)   POCT Influenza A/B (Completed)    I have discontinued Skipper Cliche. [place]'s methylPREDNISolone. I am also having her start on amoxicillin. Additionally, I am having her maintain her estradiol and betamethasone dipropionate.  Meds ordered this encounter  Medications   amoxicillin (AMOXIL) 500 MG capsule    Sig: Take 1 capsule (500 mg total) by mouth 2 (two) times daily for 10 days.    Dispense:  20 capsule    Refill:  0    Order Specific Question:   Supervising Provider    Answer:   Danise Edge A [[id number]]

## 2022-11-05 NOTE — Assessment & Plan Note (Signed)
New. Rapid strep positive.  Will rx with amoxicillin. Recommended ibuprofen/cepacol lozenges prn pain. Discard and replace toothbrush in 24 hours.  Call if symptoms worsen or if symptoms fail to improve.

## 2022-11-14 ENCOUNTER — Encounter (INDEPENDENT_AMBULATORY_CARE_PROVIDER_SITE_OTHER): Payer: Self-pay

## 2022-11-15 ENCOUNTER — Ambulatory Visit: Payer: BC Managed Care – PPO | Admitting: Family

## 2022-11-15 VITALS — BP 124/84 | HR 71 | Temp 98.0°F | Resp 16 | Wt 155.0 lb

## 2022-11-15 DIAGNOSIS — U071 COVID-19: Secondary | ICD-10-CM

## 2022-11-15 DIAGNOSIS — J020 Streptococcal pharyngitis: Secondary | ICD-10-CM | POA: Diagnosis not present

## 2022-11-15 NOTE — Progress Notes (Unsigned)
Subjective:     Patient ID: Margaret Small, female    DOB: Jan 15, 1971, 52 y.o.   MRN: [id number]  Chief Complaint  Patient presents with   Sore Throat    Still having sore throat   Covid Positive    Tested positive for covid 11/03/22   Fatigue    Complains for fatigue    Sore Throat     Discussed the use of AI scribe software for clinical note transcription with the patient, who gave verbal consent to proceed.  History of Present Illness   The patient, with a recent history of strep throat, presents with persistent sore throat and fatigue. She reports that her symptoms have only marginally improved since her last visit, with the sore throat persisting and a feeling of 'something in my throat.' She also reports extreme fatigue, feeling 'exhausted' and 'tired of being tired.' She tested positive for COVID-19 on 11/10/22 and suspects that her ongoing symptoms may be related to this. She also expresses concern about white spots in her throat, although these are not visible at the time of the visit.          Health Maintenance Due  Topic Date Due   COVID-19 Vaccine (5 - 2023-24 season) 11/30/2021   Zoster Vaccines- Shingrix (2 of 2) 12/07/2021   PAP SMEAR-Modifier  06/03/2022   INFLUENZA VACCINE  10/31/2022    Past Medical History:  Diagnosis Date   Mass of joint of left wrist     Past Surgical History:  Procedure Laterality Date   APPENDECTOMY     BACK SURGERY     hystectomy  2020   MASS EXCISION Left 10/14/2017   Procedure: EXCISION CYST LEFT WRIST VOLAR;  Surgeon: Cindee Salt, MD;  Location: [facility];  Service: Orthopedics;  Laterality: Left;  REG/UPPERARM    Family History  Problem Relation Age of Onset   Atrial fibrillation Mother    Diabetes Mellitus II Mother    Hypertension Mother    Obesity Father        ? massive stroke    Hypertension Father    Hypertension Brother    Diabetes Mellitus II Maternal Grandmother    Diabetes  Mellitus II Maternal Grandfather    CAD Maternal Grandfather    Colon cancer Paternal Grandmother    Brain cancer Paternal Grandmother    Throat cancer Paternal Grandfather    Asthma Daughter     Social History   Socioeconomic History   Marital status: Married    Spouse name: Baldo Ash   Number of children: 2   Years of education: Not on file   Highest education level: Bachelor's degree (e.g., BA, AB, BS)  Occupational History   Occupation: Engineering geologist  Tobacco Use   Smoking status: Never   Smokeless tobacco: Never  Substance and Sexual Activity   Alcohol use: Yes    Comment: social   Drug use: No   Sexual activity: Yes    Partners: Male  Other Topics Concern   Not on file  Social History Narrative   Married   66 yr old daughter (in [place])   42 yr old son   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[place])    Social Determinants of Health   Financial Resource Strain: Patient Declined (09/23/2022)   Overall Financial Resource Strain (CARDIA)    Difficulty of Paying Living Expenses: Patient declined  Food Insecurity: No Food Insecurity (09/23/2022)   Hunger Vital Sign  Worried About Programme researcher, broadcasting/film/video in the Last Year: Never true    Ran Out of Food in the Last Year: Never true  Transportation Needs: No Transportation Needs (09/23/2022)   PRAPARE - Administrator, Civil Service (Medical): No    Lack of Transportation (Non-Medical): No  Physical Activity: Insufficiently Active (09/23/2022)   Exercise Vital Sign    Days of Exercise per Week: 2 days    Minutes of Exercise per Session: 60 min  Stress: No Stress Concern Present (09/23/2022)   [employer] of Occupational Health - Occupational Stress Questionnaire    Feeling of Stress : Only a little  Social Connections: Socially Integrated (09/23/2022)   Social Connection and Isolation Panel [NHANES]    Frequency of Communication with Friends and Family: More than three times a week     Frequency of Social Gatherings with Friends and Family: Twice a week    Attends Religious Services: More than 4 times per year    Active Member of [employer] or Organizations: Yes    Attends Engineer, structural: More than 4 times per year    Marital Status: Married  Catering manager Violence: Low Risk  (11/29/2019)   Received from [facility]   Intimate Partner Violence    Insults You: Not on file    Threatens You: Not on file    Screams at [place]: Not on file    Physically Hurt: Not on file    Intimate Partner Violence Score: Not on file    Outpatient Medications Prior to Visit  Medication Sig Dispense Refill   amoxicillin (AMOXIL) 500 MG capsule Take 1 capsule (500 mg total) by mouth 2 (two) times daily for 10 days. 20 capsule 0   betamethasone dipropionate 0.05 % cream Apply topically 2 (two) times daily. Apply to dry area on chest twice daily as needed. 30 g 0   estradiol (ESTRACE) 2 MG tablet      No facility-administered medications prior to visit.    Allergies  Allergen Reactions   Macrobid [Nitrofurantoin] Other (See Comments)    Myalgias, flu-like symptoms   Sulfa Antibiotics Rash    ROS See HPI    Objective:    Physical Exam Constitutional:      General: She is not in acute distress.    Appearance: Normal appearance. She is well-developed.  HENT:     Head: Atraumatic.     Right Ear: Tympanic membrane, ear canal and external ear normal.     Left Ear: Tympanic membrane, ear canal and external ear normal.     Mouth/Throat:     Pharynx: Posterior oropharyngeal erythema (mild no erythema) present. No oropharyngeal exudate.     Tonsils: No tonsillar exudate or tonsillar abscesses. 2+ on the right. 2+ on the left.  Eyes:     General: No scleral icterus. Neck:     Thyroid: No thyromegaly.  Cardiovascular:     Rate and Rhythm: Normal rate and regular rhythm.     Heart sounds: Normal heart sounds. No murmur heard. Pulmonary:     Effort:  Pulmonary effort is normal. No respiratory distress.     Breath sounds: Normal breath sounds. No wheezing.  Musculoskeletal:     Cervical back: Neck supple.  Lymphadenopathy:     Cervical: Cervical adenopathy present.  Skin:    General: Skin is warm and dry.  Neurological:     Mental Status: She is alert and oriented to person, place, and time.  Psychiatric:        Mood and Affect: Mood normal.        Behavior: Behavior normal.        Thought Content: Thought content normal.        Judgment: Judgment normal.      BP 124/84 (BP Location: Right Arm, Patient Position: Sitting, Cuff Size: Small)   Pulse 71   Temp 98 F (36.7 C) (Temporal)   Resp 16   Wt 155 lb (70.3 kg)   LMP 09/30/2017   SpO2 99%   BMI 25.02 kg/m  Wt Readings from Last 3 Encounters:  11/15/22 155 lb (70.3 kg)  11/05/22 156 lb (70.8 kg)  09/24/22 153 lb (69.4 kg)       Assessment & Plan:   Problem List Items Addressed This Visit       Unprioritized   Strep pharyngitis    Repeated rapid strep.  She completed antibiotic course.  Rapid strep negative.       COVID-19 - Primary    I think her sore throat and fatigue are related to covid-19 infection. Discussed supportive measures. She will let me know if sore throat is not improved in 2-3 days.        I am having Dennison Bulla maintain her estradiol and betamethasone dipropionate.  No orders of the defined types were placed in this encounter.

## 2022-11-16 DIAGNOSIS — U071 COVID-19: Secondary | ICD-10-CM

## 2022-11-16 HISTORY — DX: COVID-19: U07.1

## 2022-11-16 NOTE — Patient Instructions (Signed)
VISIT SUMMARY:  During your visit, we discussed your ongoing symptoms of sore throat and fatigue. You had a recent history of strep throat and also tested positive for COVID-19. We suspect that your current symptoms may be related to COVID-19. We also discussed your concern about white spots in your throat, but these were not visible during the examination.  YOUR PLAN:  -COVID-19: COVID-19 is a viral infection that can cause a range of symptoms, including fatigue and sore throat. You should continue to rest and stay hydrated. If your symptoms do not improve by November 18, 2022, please contact our office.  -STREPTOCOCCAL PHARYNGITIS: Streptococcal Pharyngitis, also known as strep throat, is a bacterial infection that causes a sore throat. Although you were previously treated for this, your sore throat persists. Today's rapid strep test was negative, suggesting that your initial strep infection may have resolved and your current sore throat could be due to COVID-19. If your symptoms persist, we may consider retreatment for strep on November 18, 2022.  INSTRUCTIONS:  Please continue to rest and stay hydrated. Monitor your symptoms closely. If your symptoms do not improve or if they worsen, please contact our office on November 18, 2022. If your sore throat persists, we may consider retreatment for strep.

## 2022-11-16 NOTE — Assessment & Plan Note (Signed)
Repeated rapid strep.  She completed antibiotic course.  Rapid strep negative.

## 2022-11-16 NOTE — Assessment & Plan Note (Signed)
I think her sore throat and fatigue are related to covid-19 infection. Discussed supportive measures. She will let me know if sore throat is not improved in 2-3 days.

## 2022-11-18 ENCOUNTER — Telehealth: Payer: Self-pay | Admitting: Family

## 2022-11-18 NOTE — Telephone Encounter (Signed)
Pt called stating that she is still having issues with the dry throat and would like to have something called in for her if possible to the following pharmacy:  [id number] [place], [place] - [address] [place] [address] Phone: [phone]  Fax: [phone]

## 2022-11-19 MED ORDER — AMOXICILLIN 500 MG PO CAPS: 500.0000 mg | ORAL_CAPSULE | Freq: Three times a day (TID) | ORAL | 0 refills | Status: AC

## 2022-11-19 NOTE — Addendum Note (Signed)
Addended by: Sandford Craze on: 11/19/2022 08:01 AM   Modules accepted: Orders

## 2022-12-27 ENCOUNTER — Telehealth: Payer: Self-pay | Admitting: Family

## 2022-12-27 ENCOUNTER — Ambulatory Visit (INDEPENDENT_AMBULATORY_CARE_PROVIDER_SITE_OTHER): Payer: BC Managed Care – PPO | Admitting: Family

## 2022-12-27 ENCOUNTER — Encounter: Payer: Self-pay | Admitting: Family

## 2022-12-27 VITALS — BP 102/67 | HR 67 | Resp 16 | Ht 66.0 in | Wt 152.0 lb

## 2022-12-27 DIAGNOSIS — Z0000 Encounter for general adult medical examination without abnormal findings: Secondary | ICD-10-CM

## 2022-12-27 LAB — CBC WITH DIFFERENTIAL/PLATELET
Basophils Absolute: 0.1 10*3/uL (ref 0.0–0.1)
Basophils Relative: 1 % (ref 0.0–3.0)
Eosinophils Absolute: 0.2 10*3/uL (ref 0.0–0.7)
Eosinophils Relative: 3.8 % (ref 0.0–5.0)
HCT: 43.8 % (ref 36.0–46.0)
Hemoglobin: 14.3 g/dL (ref 12.0–15.0)
Lymphocytes Relative: 27 % (ref 12.0–46.0)
Lymphs Abs: 1.7 10*3/uL (ref 0.7–4.0)
MCHC: 32.6 g/dL (ref 30.0–36.0)
MCV: 97.6 fL (ref 78.0–100.0)
Monocytes Absolute: 0.4 10*3/uL (ref 0.1–1.0)
Monocytes Relative: 6.7 % (ref 3.0–12.0)
Neutro Abs: 3.9 10*3/uL (ref 1.4–7.7)
Neutrophils Relative %: 61.5 % (ref 43.0–77.0)
Platelets: 342 10*3/uL (ref 150.0–400.0)
RBC: 4.49 Mil/uL (ref 3.87–5.11)
RDW: 12.6 % (ref 11.5–15.5)
WBC: 6.3 10*3/uL (ref 4.0–10.5)

## 2022-12-27 LAB — COMPREHENSIVE METABOLIC PANEL
ALT: 13 U/L (ref 0–35)
AST: 13 U/L (ref 0–37)
Albumin: 4.4 g/dL (ref 3.5–5.2)
Alkaline Phosphatase: 49 U/L (ref 39–117)
BUN: 28 mg/dL — ABNORMAL HIGH (ref 6–23)
CO2: 29 meq/L (ref 19–32)
Calcium: 9.7 mg/dL (ref 8.4–10.5)
Chloride: 101 meq/L (ref 96–112)
Creatinine, Ser: 0.92 mg/dL (ref 0.40–1.20)
GFR: 70.85 mL/min (ref >60.00)
Glucose, Bld: 75 mg/dL (ref 70–99)
Potassium: 4.6 meq/L (ref 3.5–5.1)
Sodium: 138 meq/L (ref 135–145)
Total Bilirubin: 0.5 mg/dL (ref 0.2–1.2)
Total Protein: 6.7 g/dL (ref 6.0–8.3)

## 2022-12-27 LAB — LIPID PANEL
Cholesterol: 167 mg/dL (ref 0–200)
HDL: 67.6 mg/dL (ref >39.00)
LDL Cholesterol: 77 mg/dL (ref 0–99)
NonHDL: 99.85
Total CHOL/HDL Ratio: 2
Triglycerides: 113 mg/dL (ref 0.0–149.0)
VLDL: 22.6 mg/dL (ref 0.0–40.0)

## 2022-12-27 LAB — TSH: TSH: 2.26 u[IU]/mL (ref 0.35–5.50)

## 2022-12-27 NOTE — Telephone Encounter (Signed)
Electronic request sent 

## 2022-12-27 NOTE — Telephone Encounter (Signed)
Please call [facility], and request pap and mamo

## 2022-12-27 NOTE — Patient Instructions (Signed)
VISIT SUMMARY:  During your annual physical, we discussed your recent health activities and your family's medical history. You've been diligent with your health screenings, including mammograms, Pap smears, and colonoscopies. You've also completed your shingles vaccine and received your flu shot. We discussed the importance of getting a COVID booster shot. You've been maintaining a healthy diet and exercising regularly, which is excellent. We also discussed your son's vaping habits and your interest in getting your cholesterol and glucose levels checked.  YOUR PLAN:  -COVID BOOSTER SHOT: Given the ongoing pandemic, it's important to stay protected. I recommend getting a COVID booster shot at your local pharmacy.  -LAB WORK: To keep track of your health, I've ordered comprehensive lab work, including cholesterol and glucose levels. This will help [place] monitor any potential health risks.  -FAMILY HISTORY: We discussed your family's history of atrial fibrillation. At this time, there are no changes to your management plan based on this information.  INSTRUCTIONS:  Please get your COVID booster shot at your earliest convenience. Once the lab work is ordered, you can go to the lab at your convenience to get the tests done. There are no changes to your current health management plan. Please return to the clinic as needed.

## 2022-12-27 NOTE — Progress Notes (Signed)
Subjective:     Patient ID: Margaret Small, female    DOB: 11/29/1970, 52 y.o.   MRN: [id number]  No chief complaint on file.   HPI  Discussed the use of AI scribe software for clinical note transcription with the patient, who gave verbal consent to proceed.  History of Present Illness    The patient presents for an annual physical. She reports having completed their mammogram and is due for a Pap smear this year, which she plans to have done at their gynecologist's office. She has completed both doses of the shingles vaccine and received their flu shot last week. She is also encouraged to get a COVID booster shot at the pharmacy.  The patient reports maintaining a healthy diet and has recently restarted the [employer] diet after gaining a few pounds. She exercises two to three times a week at the gym. She had a colonoscopy in 2020 and is due for another one next year due to family history. She has regular vision and dental check-ups.  The patient's son, an Geographical information systems officer, has been using vaping products, which the patient has discussed with them. The patient is interested in having lab work done, particularly cholesterol and glucose tests, as they have not been done since 2021.  Immunizations:flu due today Diet: healthy-  Wt Readings from Last 3 Encounters:  12/27/22 152 lb (68.9 kg)  11/15/22 155 lb (70.3 kg)  11/05/22 156 lb (70.8 kg)  Exercise: 2-3 times a week Colonoscopy:2020 Pap Smear: up to date GYN Mammogram: up to date GYN Vision: up to date Dental: up to date   BP Readings from Last 3 Encounters:  12/27/22 102/67  11/15/22 124/84  11/05/22 122/73   Wt Readings from Last 3 Encounters:  12/27/22 152 lb (68.9 kg)  11/15/22 155 lb (70.3 kg)  11/05/22 156 lb (70.8 kg)   Lab Results  Component Value Date   CHOL 186 08/17/2019   HDL 55.60 08/17/2019   LDLCALC 107 (H) 08/17/2019   TRIG 121.0 08/17/2019   CHOLHDL 3 08/17/2019     Health Maintenance  Due  Topic Date Due   Hepatitis C Screening  Never done   Cervical Cancer Screening (HPV/Pap Cotest)  06/03/2022   COVID-19 Vaccine (5 - 2023-24 season) 12/01/2022    Past Medical History:  Diagnosis Date   COVID-19 11/16/2022   Mass of joint of left wrist     Past Surgical History:  Procedure Laterality Date   APPENDECTOMY     BACK SURGERY     hystectomy  2020   MASS EXCISION Left 10/14/2017   Procedure: EXCISION CYST LEFT WRIST VOLAR;  Surgeon: Cindee Salt, MD;  Location: [facility];  Service: Orthopedics;  Laterality: Left;  REG/UPPERARM    Family History  Problem Relation Age of Onset   Atrial fibrillation Mother    Diabetes Mellitus II Mother    Hypertension Mother    Obesity Father        ? massive stroke    Hypertension Father    Hypertension Brother    Atrial fibrillation Brother    Diabetes Mellitus II Maternal Grandmother    Diabetes Mellitus II Maternal Grandfather    CAD Maternal Grandfather    Colon cancer Paternal Grandmother    Brain cancer Paternal Grandmother    Throat cancer Paternal Grandfather    Asthma Daughter     Social History   Socioeconomic History   Marital status: Married    Spouse name: Baldo Ash  Number of children: 2   Years of education: Not on file   Highest education level: Bachelor's degree (e.g., BA, AB, BS)  Occupational History   Occupation: Engineering geologist  Tobacco Use   Smoking status: Never   Smokeless tobacco: Never  Substance and Sexual Activity   Alcohol use: Yes    Comment: social   Drug use: No   Sexual activity: Yes    Partners: Male  Other Topics Concern   Not on file  Social History Narrative   Married   54 yr old daughter (in [place])   52 yr old son   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[place])    Social Determinants of Health   Financial Resource Strain: Patient Declined (09/23/2022)   Overall Financial Resource Strain (CARDIA)    Difficulty of Paying Living  Expenses: Patient declined  Food Insecurity: No Food Insecurity (09/23/2022)   Hunger Vital Sign    Worried About Running Out of Food in the Last Year: Never true    Ran Out of Food in the Last Year: Never true  Transportation Needs: No Transportation Needs (09/23/2022)   PRAPARE - Administrator, Civil Service (Medical): No    Lack of Transportation (Non-Medical): No  Physical Activity: Insufficiently Active (09/23/2022)   Exercise Vital Sign    Days of Exercise per Week: 2 days    Minutes of Exercise per Session: 60 min  Stress: No Stress Concern Present (09/23/2022)   [employer] of Occupational Health - Occupational Stress Questionnaire    Feeling of Stress : Only a little  Social Connections: Socially Integrated (09/23/2022)   Social Connection and Isolation Panel [NHANES]    Frequency of Communication with Friends and Family: More than three times a week    Frequency of Social Gatherings with Friends and Family: Twice a week    Attends Religious Services: More than 4 times per year    Active Member of [employer] or Organizations: Yes    Attends Engineer, structural: More than 4 times per year    Marital Status: Married  Catering manager Violence: Low Risk  (11/29/2019)   Received from [facility]   Intimate Partner Violence    Insults You: Not on file    Threatens You: Not on file    Screams at [place]: Not on file    Physically Hurt: Not on file    Intimate Partner Violence Score: Not on file    Outpatient Medications Prior to Visit  Medication Sig Dispense Refill   betamethasone dipropionate 0.05 % cream Apply topically 2 (two) times daily. Apply to dry area on chest twice daily as needed. 30 g 0   estradiol (ESTRACE) 2 MG tablet      Multiple Vitamin (MULTIVITAMIN) capsule Take 1 capsule by mouth daily.     No facility-administered medications prior to visit.    Allergies  Allergen Reactions   Macrobid [Nitrofurantoin] Other (See  Comments)    Myalgias, flu-like symptoms   Sulfa Antibiotics Rash    ROS See HPI    Objective:    Physical Exam   BP 102/67 (BP Location: Right Arm, Patient Position: Sitting, Cuff Size: Small)   Pulse 67   Resp 16   Ht 5\' 6"  (1.676 m)   Wt 152 lb (68.9 kg)   LMP 09/30/2017   SpO2 98%   BMI 24.53 kg/m  Wt Readings from Last 3 Encounters:  12/27/22 152 lb (68.9  kg)  11/15/22 155 lb (70.3 kg)  11/05/22 156 lb (70.8 kg)   Physical Exam  Constitutional: She is oriented to person, place, and time. She appears well-developed and well-nourished. No distress.  HENT:  Head: Normocephalic and atraumatic.  Right Ear: Tympanic membrane and ear canal normal.  Left Ear: Tympanic membrane and ear canal normal.  Mouth/Throat: Oropharynx is clear and moist.  Eyes: Pupils are equal, round, and reactive to light. No scleral icterus.  Neck: Normal range of motion. No thyromegaly present.  Cardiovascular: Normal rate and regular rhythm.   No murmur heard. Pulmonary/Chest: Effort normal and breath sounds normal. No respiratory distress. He has no wheezes. She has no rales. She exhibits no tenderness.  Abdominal: Soft. Bowel sounds are normal. She exhibits no distension and no mass. There is no tenderness. There is no rebound and no guarding.  Musculoskeletal: She exhibits no edema.  Lymphadenopathy:    She has no cervical adenopathy.  Neurological: She is alert and oriented to person, place, and time. She has normal patellar reflexes. She exhibits normal muscle tone. Coordination normal.  Skin: Skin is warm and dry.  Psychiatric: She has a normal mood and affect. Her behavior is normal. Judgment and thought content normal.  Pelvic/breast: deferred to GYN        Assessment & Plan:       Assessment & Plan:   Problem List Items Addressed This Visit       Unprioritized   Preventative health care - Primary    Continue healthy diet and exercise.General Health Maintenance Up to date  on mammogram, Pap smear, colonoscopy, and vaccinations including shingles and influenza. Discussed the importance of a COVID booster shot. -Encouraged to get a COVID booster shot at the pharmacy. -Order comprehensive lab work including cholesterol and glucose levels. -will request Mammo/pap reports from GYN.       Relevant Orders   Comp Met (CMET)   CBC w/Diff   TSH   Lipid panel    I am having Dennison Bulla maintain her estradiol, betamethasone dipropionate, and multivitamin.  No orders of the defined types were placed in this encounter.

## 2022-12-27 NOTE — Assessment & Plan Note (Addendum)
Continue healthy diet and exercise.General Health Maintenance Up to date on mammogram, Pap smear, colonoscopy, and vaccinations including shingles and influenza. Discussed the importance of a COVID booster shot. -Encouraged to get a COVID booster shot at the pharmacy. -Order comprehensive lab work including cholesterol and glucose levels. -will request Mammo/pap reports from GYN.

## 2023-04-09 DIAGNOSIS — D2261 Melanocytic nevi of right upper limb, including shoulder: Secondary | ICD-10-CM | POA: Diagnosis not present

## 2023-04-09 DIAGNOSIS — L821 Other seborrheic keratosis: Secondary | ICD-10-CM | POA: Diagnosis not present

## 2023-04-09 DIAGNOSIS — D2262 Melanocytic nevi of left upper limb, including shoulder: Secondary | ICD-10-CM | POA: Diagnosis not present

## 2023-04-09 DIAGNOSIS — D485 Neoplasm of uncertain behavior of skin: Secondary | ICD-10-CM | POA: Diagnosis not present

## 2023-06-17 DIAGNOSIS — M76822 Posterior tibial tendinitis, left leg: Secondary | ICD-10-CM | POA: Diagnosis not present

## 2023-10-28 DIAGNOSIS — Z1231 Encounter for screening mammogram for malignant neoplasm of breast: Secondary | ICD-10-CM | POA: Diagnosis not present

## 2023-10-28 DIAGNOSIS — Z6825 Body mass index (BMI) 25.0-25.9, adult: Secondary | ICD-10-CM | POA: Diagnosis not present

## 2023-10-28 DIAGNOSIS — Z01419 Encounter for gynecological examination (general) (routine) without abnormal findings: Secondary | ICD-10-CM | POA: Diagnosis not present

## 2023-10-28 LAB — HM PAP SMEAR: HM Pap smear: NEGATIVE

## 2023-11-03 ENCOUNTER — Other Ambulatory Visit: Payer: Self-pay | Admitting: Obstetrics and Gynecology

## 2023-11-03 DIAGNOSIS — R928 Other abnormal and inconclusive findings on diagnostic imaging of breast: Secondary | ICD-10-CM

## 2023-11-13 ENCOUNTER — Ambulatory Visit
Admission: RE | Admit: 2023-11-13 | Discharge: 2023-11-13 | Disposition: A | Discharge status: Home / Self Care | Source: Ambulatory Visit | Attending: Obstetrics and Gynecology | Admitting: Obstetrics and Gynecology

## 2023-11-13 DIAGNOSIS — R928 Other abnormal and inconclusive findings on diagnostic imaging of breast: Secondary | ICD-10-CM | POA: Diagnosis not present

## 2023-11-13 DIAGNOSIS — N6011 Diffuse cystic mastopathy of right breast: Secondary | ICD-10-CM | POA: Diagnosis not present

## 2023-11-13 DIAGNOSIS — N6012 Diffuse cystic mastopathy of left breast: Secondary | ICD-10-CM | POA: Diagnosis not present

## 2023-11-26 DIAGNOSIS — Z1211 Encounter for screening for malignant neoplasm of colon: Secondary | ICD-10-CM | POA: Diagnosis not present

## 2023-11-26 DIAGNOSIS — Z83719 Family history of colon polyps, unspecified: Secondary | ICD-10-CM | POA: Diagnosis not present

## 2023-12-23 ENCOUNTER — Ambulatory Visit: Payer: Self-pay

## 2023-12-23 NOTE — Telephone Encounter (Signed)
 Appt scheduled

## 2023-12-23 NOTE — Telephone Encounter (Signed)
 FYI Only or Action Required?: FYI only for provider.  Patient was last seen in primary care on 12/27/2022 by Margaret Setter, NP.  Called Nurse Triage reporting Sore Throat.  Symptoms began several days ago.  Interventions attempted: Nothing.  Symptoms are: gradually worsening.  Triage Disposition: See Physician Within 24 Hours  Patient/caregiver understands and will follow disposition?: Yes, will follow disposition  Copied from CRM #[id number]. Topic: Clinical - Red Word Triage >> Dec 23, 2023  2:38 PM Roselie BROCKS wrote: Red Word that prompted transfer to Nurse Triage: Patient states she thinks she has strep throat, swollen,itchy pain when swallowing Reason for Disposition  Earache also present  Answer Assessment - Initial Assessment Questions 1. ONSET: When did the throat start hurting? (Hours or days ago)      About 3 days 2. SEVERITY: How bad is the sore throat? (Scale 1-10; mild, moderate or severe)     Morning is  fine, right now 6 3. STREP EXPOSURE: Has there been any exposure to strep within the past week? If Yes, ask: What type of contact occurred?      unsure 4.  VIRAL SYMPTOMS: Are there any symptoms of a cold, such as a runny nose, cough, hoarse voice or red eyes?      Ear pain 5. FEVER: Do you have a fever? If Yes, ask: What is your temperature, how was it measured, and when did it start?     denies 6. PUS ON THE TONSILS: Is there pus on the tonsils in the back of your throat?     yes 7. OTHER SYMPTOMS: Do you have any other symptoms? (e.g., difficulty breathing, headache, rash)     denies  Protocols used: Sore Throat-A-AH

## 2023-12-24 ENCOUNTER — Ambulatory Visit: Admitting: Family Medicine

## 2023-12-24 ENCOUNTER — Encounter: Payer: Self-pay | Admitting: Family Medicine

## 2023-12-24 VITALS — BP 120/80 | HR 62 | Temp 98.6°F | Ht 66.0 in | Wt 157.2 lb

## 2023-12-24 DIAGNOSIS — J029 Acute pharyngitis, unspecified: Secondary | ICD-10-CM | POA: Diagnosis not present

## 2023-12-24 LAB — POCT RAPID STREP A (OFFICE): Rapid Strep A Screen: NEGATIVE

## 2023-12-24 LAB — POC COVID19 BINAXNOW: SARS Coronavirus 2 Ag: NEGATIVE

## 2023-12-24 MED ORDER — AMOXICILLIN 875 MG PO TABS: 875.0000 mg | ORAL_TABLET | Freq: Two times a day (BID) | ORAL | 0 refills | Status: DC

## 2023-12-24 NOTE — Progress Notes (Signed)
 [facility] [address], [address] [place], [place] [address] [phone]  Date:  12/24/2023   Name:  Margaret Small   DOB:  [birth year]   MRN:  [id number]  PCP:  Daryl Setter, NP    Chief Complaint: Sore Throat (Onset 12/20/2023)   History of Present Illness:  Margaret Small is a 53 y.o. very pleasant female patient who presents with the following:  Pt seen today with concern of acute illness- ST for 5 days Generally in good health Pt of my partner Setter Daryl  Discussed the use of AI scribe software for clinical note transcription with the patient, who gave verbal consent to proceed.  History of Present Illness Margaret Small is a 53 year old female who presents with a sore throat.  She has been experiencing throat pain for five days, described as a constant ache that worsens with talking. The pain was significant enough to disrupt her sleep last night. No difficulty swallowing, fever, chills, cough, vomiting, or diarrhea. She recalls having strep throat around the same time last year. Motrin  provided minimal relief, reducing the pain from a 'five' to a 'four'.  She mentions that her lymph nodes feel swollen and her neck is slightly painful. No runny nose or additional respiratory issues.  Her social history includes working from home. She is an 'empty nester' with her children no longer living at home. She notes her son gets strep all the time and she had strep throat last year    Patient Active Problem List   Diagnosis Date Noted   Lumbar radiculopathy 09/24/2022   Cervical radiculopathy 09/24/2022   Preventative health care 12/25/2021   Eczema 12/25/2021   Intertrigo 12/25/2021   Flank pain 04/18/2021   Left lower quadrant abdominal pain 04/18/2021   Ganglion of left wrist 09/17/2017    Past Medical History:  Diagnosis Date   COVID-19 11/16/2022   Mass of joint of  left wrist     Past Surgical History:  Procedure Laterality Date   APPENDECTOMY     BACK SURGERY     hystectomy  2020   MASS EXCISION Left 10/14/2017   Procedure: EXCISION CYST LEFT WRIST VOLAR;  Surgeon: Murrell Kuba, MD;  Location: [facility];  Service: Orthopedics;  Laterality: Left;  REG/UPPERARM    Social History   Tobacco Use   Smoking status: Never   Smokeless tobacco: Never  Substance Use Topics   Alcohol use: Yes    Comment: social   Drug use: No    Family History  Problem Relation Age of Onset   Atrial fibrillation Mother    Diabetes Mellitus II Mother    Hypertension Mother    Obesity Father        ? massive stroke    Hypertension Father    Hypertension Brother    Atrial fibrillation Brother    Diabetes Mellitus II Maternal Grandmother    Diabetes Mellitus II Maternal Grandfather    CAD Maternal Grandfather    Colon cancer Paternal Grandmother    Brain cancer Paternal Grandmother    Throat cancer Paternal Grandfather    Asthma Daughter     Allergies  Allergen Reactions   Macrobid [Nitrofurantoin] Other (See Comments)    Myalgias, flu-like symptoms   Sulfa Antibiotics Rash    Medication list has been reviewed and updated.  Current Outpatient Medications on File Prior to Visit  Medication Sig Dispense Refill  betamethasone  dipropionate 0.05 % cream Apply topically 2 (two) times daily. Apply to dry area on chest twice daily as needed. 30 g 0   escitalopram (LEXAPRO) 10 MG tablet Take 10 mg by mouth daily.     estradiol (ESTRACE) 2 MG tablet      Multiple Vitamin (MULTIVITAMIN) capsule Take 1 capsule by mouth daily.     No current facility-administered medications on file prior to visit.    Review of Systems:  As per HPI- otherwise negative.  Physical Examination: Vitals:   12/24/23 1356  BP: 120/80  Pulse: 62  Temp: 98.6 F (37 C)  SpO2: 97%   Vitals:   12/24/23 1356  Weight: 157 lb 3.2 oz (71.3 kg)  Height: 5' 6  (1.676 m)   Body mass index is 25.37 kg/m. Ideal Body Weight: Weight in (lb) to have BMI = 25: 154.6  GEN: no acute distress. Normal weight, looks well  HEENT: Atraumatic, Normocephalic.  Bilateral TM wnl, oropharynx normal.  PEERL,EOMI.   Ears and Nose: No external deformity. CV: RRR, No M/G/R. No JVD. No thrill. No extra heart sounds. PULM: CTA B, no wheezes, crackles, rhonchi. No retractions. No resp. distress. No accessory muscle use. EXTR: No c/c/e PSYCH: Normally interactive. Conversant.   Results for orders placed or performed in visit on 12/24/23  POCT rapid strep A   Collection Time: 12/24/23  2:26 PM  Result Value Ref Range   Rapid Strep A Screen Negative Negative  POC COVID-19 BinaxNow   Collection Time: 12/24/23  3:06 PM  Result Value Ref Range   SARS Coronavirus 2 Ag Negative Negative      Assessment and Plan: Sore throat - Plan: POCT rapid strep A, POC COVID-19 BinaxNow, amoxicillin  (AMOXIL ) 875 MG tablet  Assessment & Plan Acute pharyngitis Acute pharyngitis with sore throat, negative rapid strep test. Consider viral infection, allergies, or undetected strep. Possible COVID-19 if symptoms persist or worsen. - Prescribe amoxicillin  500 mg twice daily for 10 days if no improvement in a few days. - Advise trial of allergy medication for a few days. - Instruct to report worsening symptoms or fever. - Send prescription to [facility].  Signed Harlene Schroeder, MD

## 2023-12-24 NOTE — Patient Instructions (Signed)
 Rapid strep and covid negative today.  This may be a viral illness However if you want to go ahead and take amoxicillin  that is ok In any case let me know if not improving over the next several days- Sooner if worse.

## 2024-01-06 ENCOUNTER — Telehealth: Payer: Self-pay | Admitting: Family

## 2024-01-06 ENCOUNTER — Encounter: Payer: Self-pay | Admitting: Family

## 2024-01-06 ENCOUNTER — Ambulatory Visit (INDEPENDENT_AMBULATORY_CARE_PROVIDER_SITE_OTHER): Admitting: Family

## 2024-01-06 VITALS — BP 107/68 | HR 58 | Temp 99.0°F | Resp 16 | Ht 66.0 in | Wt 160.0 lb

## 2024-01-06 DIAGNOSIS — Z0000 Encounter for general adult medical examination without abnormal findings: Secondary | ICD-10-CM | POA: Diagnosis not present

## 2024-01-06 NOTE — Progress Notes (Signed)
 Subjective:     Patient ID: Margaret Small, female    DOB: [birth year], 53 y.o.   MRN: [id number]  Chief Complaint  Patient presents with   Annual Exam    HPI  Discussed the use of AI scribe software for clinical note transcription with the patient, who gave verbal consent to proceed.  History of Present Illness  Margaret Small is a 53 year old female who presents for an annual physical exam.  Immunization updates were discussed, including the hepatitis B and Prevnar vaccines. She has received her flu shot at work and has decided against further COVID boosters after completing the initial series.  She feels her weight is increasing despite a healthy diet and regular exercise, including daily walking and gym workouts. She previously tried the [employer] but finds it challenging to maintain weight loss.  Her colonoscopy was completed with a ten-year clearance. She attended her Pap smear and mammogram appointments but is unsure about receiving the Pap results. She uses readers for vision, especially for extended reading, and is up to date with dental check-ups.  There are no current symptoms of cough, cold, skin concerns, hearing issues, digestive issues, urinary issues, muscle or joint pain, headaches, or mental health concerns. She has completed menopause and expresses relief.  There is a family history of atrial fibrillation in her mother and brother. She occasionally feels 'flutters' but does not experience shortness of breath or other symptoms.  Immunizations: had a flu shot at work Diet: healthy Exercise: regular- wlks most days Colonoscopy: August- good for 10 years Pap Smear: due Mammogram: up to date Vision: up to date Dental: up to date     Health Maintenance Due  Topic Date Due   Hepatitis C Screening  Never done   Hepatitis B Vaccines 19-59 Average Risk (1 of 3 - 19+ 3-dose series) Never done   Pneumococcal Vaccine: 50+ Years (1 of 1 -  PCV) Never done   Cervical Cancer Screening (HPV/Pap Cotest)  08/02/2023   COVID-19 Vaccine (5 - 2025-26 season) 12/01/2023    Past Medical History:  Diagnosis Date   COVID-19 11/16/2022   Mass of joint of left wrist     Past Surgical History:  Procedure Laterality Date   APPENDECTOMY     BACK SURGERY     hystectomy  2020   MASS EXCISION Left 10/14/2017   Procedure: EXCISION CYST LEFT WRIST VOLAR;  Surgeon: Murrell Kuba, MD;  Location: [facility];  Service: Orthopedics;  Laterality: Left;  REG/UPPERARM    Family History  Problem Relation Age of Onset   Atrial fibrillation Mother    Diabetes Mellitus II Mother    Hypertension Mother    Obesity Father        ? massive stroke    Hypertension Father    Hypertension Brother    Atrial fibrillation Brother    Diabetes Mellitus II Maternal Grandmother    Diabetes Mellitus II Maternal Grandfather    CAD Maternal Grandfather    Colon cancer Paternal Grandmother    Brain cancer Paternal Grandmother    Throat cancer Paternal Grandfather    Asthma Daughter     Social History   Socioeconomic History   Marital status: Married    Spouse name: Lupita   Number of children: 2   Years of education: Not on file   Highest education level: Bachelor's degree (e.g., BA, AB, BS)  Occupational History   Occupation: Engineering geologist  Tobacco Use  Smoking status: Never   Smokeless tobacco: Never  Substance and Sexual Activity   Alcohol use: Yes    Comment: social   Drug use: No   Sexual activity: Yes    Partners: Male  Other Topics Concern   Not on file  Social History Narrative   Married   74 yr old daughter (in [place])   59 yr old son   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[place])    Social Drivers of Health   Financial Resource Strain: Patient Declined (09/23/2022)   Overall Financial Resource Strain (CARDIA)    Difficulty of Paying Living Expenses: Patient declined  Food Insecurity:  No Food Insecurity (09/23/2022)   Hunger Vital Sign    Worried About Running Out of Food in the Last Year: Never true    Ran Out of Food in the Last Year: Never true  Transportation Needs: No Transportation Needs (09/23/2022)   PRAPARE - Administrator, Civil Service (Medical): No    Lack of Transportation (Non-Medical): No  Physical Activity: Insufficiently Active (09/23/2022)   Exercise Vital Sign    Days of Exercise per Week: 2 days    Minutes of Exercise per Session: 60 min  Stress: No Stress Concern Present (09/23/2022)   [employer] of Occupational Health - Occupational Stress Questionnaire    Feeling of Stress : Only a little  Social Connections: Socially Integrated (09/23/2022)   Social Connection and Isolation Panel    Frequency of Communication with Friends and Family: More than three times a week    Frequency of Social Gatherings with Friends and Family: Twice a week    Attends Religious Services: More than 4 times per year    Active Member of [employer] or Organizations: Yes    Attends Banker Meetings: More than 4 times per year    Marital Status: Married  Catering manager Violence: Low Risk  (11/29/2019)   Received from [employer]   Intimate Partner Violence    Insults You: Not on file    Threatens You: Not on file    Screams at [place]: Not on file    Physically Hurt: Not on file    Intimate Partner Violence Score: Not on file    Outpatient Medications Prior to Visit  Medication Sig Dispense Refill   betamethasone  dipropionate 0.05 % cream Apply topically 2 (two) times daily. Apply to dry area on chest twice daily as needed. 30 g 0   escitalopram (LEXAPRO) 10 MG tablet Take 10 mg by mouth daily.     estradiol (ESTRACE) 2 MG tablet      Multiple Vitamin (MULTIVITAMIN) capsule Take 1 capsule by mouth daily.     amoxicillin  (AMOXIL ) 875 MG tablet Take 1 tablet (875 mg total) by mouth 2 (two) times daily. 20 tablet 0   No facility-administered  medications prior to visit.    Allergies  Allergen Reactions   Macrobid [Nitrofurantoin] Other (See Comments)    Myalgias, flu-like symptoms   Sulfa Antibiotics Rash    Review of Systems  Constitutional:  Negative for weight loss.  HENT:  Negative for congestion and hearing loss.   Eyes:  Negative for blurred vision.  Respiratory:  Negative for cough.   Cardiovascular:  Negative for leg swelling.  Gastrointestinal:  Negative for constipation and diarrhea.  Genitourinary:  Negative for dysuria and frequency.  Musculoskeletal:  Negative for joint pain and myalgias.  Skin:  Negative for rash.  Neurological:  Negative for headaches.  Psychiatric/Behavioral:         Denies depression/anxiety       Objective:    Physical Exam   BP 107/68 (BP Location: Right Arm, Patient Position: Sitting, Cuff Size: Normal)   Pulse (!) 58   Temp 99 F (37.2 C) (Oral)   Resp 16   Ht 5' 6 (1.676 m)   Wt 160 lb (72.6 kg)   LMP 09/30/2017   SpO2 99%   BMI 25.82 kg/m  Wt Readings from Last 3 Encounters:  01/06/24 160 lb (72.6 kg)  12/24/23 157 lb 3.2 oz (71.3 kg)  12/27/22 152 lb (68.9 kg)  Physical Exam  Constitutional: She is oriented to person, place, and time. She appears well-developed and well-nourished. No distress.  HENT:  Head: Normocephalic and atraumatic.  Right Ear: Tympanic membrane and ear canal normal.  Left Ear: Tympanic membrane and ear canal normal.  Mouth/Throat: Oropharynx is clear and moist.  Eyes: Pupils are equal, round, and reactive to light. No scleral icterus.  Neck: Normal range of motion. No thyromegaly present.  Cardiovascular: Normal rate and regular rhythm.   No murmur heard. Pulmonary/Chest: Effort normal and breath sounds normal. No respiratory distress. He has no wheezes. She has no rales. She exhibits no tenderness.  Abdominal: Soft. Bowel sounds are normal. She exhibits no distension and no mass. There is no tenderness. There is no rebound and no  guarding.  Musculoskeletal: She exhibits no edema.  Lymphadenopathy:    She has no cervical adenopathy.  Neurological: She is alert and oriented to person, place, and time. She has normal patellar reflexes. She exhibits normal muscle tone. Coordination normal.  Skin: Skin is warm and dry.  Psychiatric: She has a normal mood and affect. Her behavior is normal. Judgment and thought content normal.             Assessment & Plan:        Assessment & Plan:   Problem List Items Addressed This Visit       Unprioritized   Preventative health care - Primary    Routine annual exam completed. Menopause complete. Asymptomatic with family history of atrial fibrillation. Previous labs normal, no current need for labs. - Discussed healthy diet and exercise. - Emphasized weight management and healthy lifestyle. - Scheduled next annual physical in one year.  Immunization Counseling and Recommendations Reviewed immunization status. Discussed hepatitis B and Prevnar vaccines. Hepatitis B is a two-dose series, Prevnar may need booster in five years. Unfortunately, we are out of Prevnar vaccine today. Flu shot received at work. COVID booster not pursued. Discussed vaccine side effects, including shingles vaccine. - Provided information on hepatitis B and Prevnar vaccines. - Advised to schedule nurse visit for vaccinations if desired before next physical if desired.  General Health Maintenance Reviewed health maintenance screenings. Colonoscopy up to date. Pap smear and mammogram performed, awaiting Pap results. Vision and dental care up to date. Discussed reading glasses for presbyopia. - Confirm Pap smear results with [facility]. - Continue routine vision and dental care.       I have discontinued Margaret ORN. Raysor's amoxicillin . I am also having her maintain her estradiol, betamethasone  dipropionate, multivitamin, and escitalopram.  No orders of the defined types were  placed in this encounter.

## 2024-01-06 NOTE — Telephone Encounter (Signed)
 Electronic request made

## 2024-01-06 NOTE — Patient Instructions (Addendum)
 VISIT SUMMARY:  Today, you had your annual physical exam. We discussed your immunizations, weight management, and family history of atrial fibrillation. Your routine screenings are up to date, and you are asymptomatic.  YOUR PLAN:  ADULT WELLNESS VISIT: Routine annual exam completed. Menopause complete. Asymptomatic with family history of atrial fibrillation. -Continue with your healthy diet and regular exercise. -Focus on weight management and maintaining a healthy lifestyle. -Your next annual physical is scheduled for one year from now.  IMMUNIZATION COUNSELING AND RECOMMENDATIONS: Reviewed immunization status and discussed hepatitis B and Prevnar 20 vaccines. -Hepatitis B is a two-dose series.  -You received your flu shot at work. You decided not to pursue further COVID boosters. -If you decide to get the hepatitis B or Prevnar vaccines, schedule a nurse visit before your next physical. -We discussed the side effects of vaccines, including the shingles vaccine.  GENERAL HEALTH MAINTENANCE: Reviewed health maintenance screenings and routine care. -Your colonoscopy is up to date. -Pap smear and mammogram have been performed; we are awaiting Pap results. -Continue using reading glasses for presbyopia. -Keep up with routine vision and dental care. -Confirm Pap smear results with [facility].

## 2024-01-06 NOTE — Assessment & Plan Note (Signed)
  Routine annual exam completed. Menopause complete. Asymptomatic with family history of atrial fibrillation. Previous labs normal, no current need for labs. - Discussed healthy diet and exercise. - Emphasized weight management and healthy lifestyle. - Scheduled next annual physical in one year.  Immunization Counseling and Recommendations Reviewed immunization status. Discussed hepatitis B and Prevnar vaccines. Hepatitis B is a two-dose series, Prevnar may need booster in five years. Unfortunately, we are out of Prevnar vaccine today. Flu shot received at work. COVID booster not pursued. Discussed vaccine side effects, including shingles vaccine. - Provided information on hepatitis B and Prevnar vaccines. - Advised to schedule nurse visit for vaccinations if desired before next physical if desired.  General Health Maintenance Reviewed health maintenance screenings. Colonoscopy up to date. Pap smear and mammogram performed, awaiting Pap results. Vision and dental care up to date. Discussed reading glasses for presbyopia. - Confirm Pap smear results with [facility]. - Continue routine vision and dental care.

## 2024-01-06 NOTE — Telephone Encounter (Signed)
 Please request copy of Pap from [facility].
